# Patient Record
Sex: Female | Born: 1990 | Race: Black or African American | Hispanic: No | Marital: Single | Smoking: Former smoker
Health system: Southern US, Community
[De-identification: ages and names within clinical notes are randomized; demographics above are authoritative.]

## PROBLEM LIST (undated history)

## (undated) DIAGNOSIS — I10 Essential (primary) hypertension: Secondary | ICD-10-CM

## (undated) DIAGNOSIS — F17200 Nicotine dependence, unspecified, uncomplicated: Secondary | ICD-10-CM

## (undated) DIAGNOSIS — J40 Bronchitis, not specified as acute or chronic: Secondary | ICD-10-CM

## (undated) HISTORY — DX: Nicotine dependence, unspecified, uncomplicated: F17.200

## (undated) HISTORY — PX: NO PAST SURGERIES: SHX2092

---

## 2009-07-27 ENCOUNTER — Emergency Department (HOSPITAL_COMMUNITY): Admission: EM | Admit: 2009-07-27 | Discharge: 2009-07-27 | Payer: Self-pay | Admitting: Emergency Medicine

## 2009-12-21 ENCOUNTER — Inpatient Hospital Stay (HOSPITAL_COMMUNITY): Admission: AD | Admit: 2009-12-21 | Discharge: 2009-12-21 | Payer: Self-pay | Admitting: Family Medicine

## 2010-10-10 LAB — URINALYSIS, ROUTINE W REFLEX MICROSCOPIC
Glucose, UA: NEGATIVE mg/dL
Hgb urine dipstick: NEGATIVE
Specific Gravity, Urine: 1.014 (ref 1.005–1.030)
pH: 7 (ref 5.0–8.0)

## 2010-10-10 LAB — GC/CHLAMYDIA PROBE AMP, GENITAL: GC Probe Amp, Genital: NEGATIVE

## 2010-10-10 LAB — WET PREP, GENITAL: Trich, Wet Prep: NONE SEEN

## 2010-10-10 LAB — POCT PREGNANCY, URINE: Preg Test, Ur: NEGATIVE

## 2011-01-13 ENCOUNTER — Ambulatory Visit (INDEPENDENT_AMBULATORY_CARE_PROVIDER_SITE_OTHER): Payer: BC Managed Care – PPO | Admitting: Women's Health

## 2011-01-13 ENCOUNTER — Other Ambulatory Visit: Payer: Self-pay | Admitting: Women's Health

## 2011-01-13 ENCOUNTER — Other Ambulatory Visit (HOSPITAL_COMMUNITY)
Admission: RE | Admit: 2011-01-13 | Discharge: 2011-01-13 | Disposition: A | Discharge status: Home / Self Care | Payer: BC Managed Care – PPO | Source: Ambulatory Visit | Attending: Gynecology | Admitting: Gynecology

## 2011-01-13 DIAGNOSIS — Z124 Encounter for screening for malignant neoplasm of cervix: Secondary | ICD-10-CM | POA: Insufficient documentation

## 2011-01-13 DIAGNOSIS — Z01419 Encounter for gynecological examination (general) (routine) without abnormal findings: Secondary | ICD-10-CM

## 2011-02-11 ENCOUNTER — Emergency Department (HOSPITAL_COMMUNITY)
Admission: EM | Admit: 2011-02-11 | Discharge: 2011-02-12 | Disposition: A | Discharge status: Home / Self Care | Payer: Worker's Compensation | Attending: Emergency Medicine | Admitting: Emergency Medicine

## 2011-02-11 DIAGNOSIS — W260XXA Contact with knife, initial encounter: Secondary | ICD-10-CM | POA: Insufficient documentation

## 2011-02-11 DIAGNOSIS — IMO0002 Reserved for concepts with insufficient information to code with codable children: Secondary | ICD-10-CM | POA: Insufficient documentation

## 2011-02-11 DIAGNOSIS — M79609 Pain in unspecified limb: Secondary | ICD-10-CM | POA: Insufficient documentation

## 2011-02-11 DIAGNOSIS — Z23 Encounter for immunization: Secondary | ICD-10-CM | POA: Insufficient documentation

## 2011-02-11 DIAGNOSIS — Y9289 Other specified places as the place of occurrence of the external cause: Secondary | ICD-10-CM | POA: Insufficient documentation

## 2011-02-11 DIAGNOSIS — J45909 Unspecified asthma, uncomplicated: Secondary | ICD-10-CM | POA: Insufficient documentation

## 2011-02-12 ENCOUNTER — Emergency Department (HOSPITAL_COMMUNITY): Payer: Worker's Compensation

## 2011-03-30 ENCOUNTER — Encounter (HOSPITAL_COMMUNITY): Payer: Self-pay | Admitting: *Deleted

## 2011-03-30 ENCOUNTER — Inpatient Hospital Stay (HOSPITAL_COMMUNITY)
Admission: AD | Admit: 2011-03-30 | Discharge: 2011-03-30 | Disposition: A | Discharge status: Home / Self Care | Payer: BC Managed Care – PPO | Source: Ambulatory Visit | Attending: Family Medicine | Admitting: Family Medicine

## 2011-03-30 DIAGNOSIS — K5900 Constipation, unspecified: Secondary | ICD-10-CM | POA: Insufficient documentation

## 2011-03-30 DIAGNOSIS — J45909 Unspecified asthma, uncomplicated: Secondary | ICD-10-CM | POA: Insufficient documentation

## 2011-03-30 DIAGNOSIS — F17200 Nicotine dependence, unspecified, uncomplicated: Secondary | ICD-10-CM | POA: Insufficient documentation

## 2011-03-30 DIAGNOSIS — R109 Unspecified abdominal pain: Secondary | ICD-10-CM | POA: Insufficient documentation

## 2011-03-30 DIAGNOSIS — R195 Other fecal abnormalities: Secondary | ICD-10-CM | POA: Insufficient documentation

## 2011-03-30 HISTORY — DX: Bronchitis, not specified as acute or chronic: J40

## 2011-03-30 LAB — OCCULT BLOOD X 1 CARD TO LAB, STOOL: Fecal Occult Bld: NEGATIVE

## 2011-03-30 LAB — POCT PREGNANCY, URINE: Preg Test, Ur: NEGATIVE

## 2011-03-30 LAB — CBC
Hemoglobin: 11.7 g/dL — ABNORMAL LOW (ref 12.0–15.0)
Platelets: 284 10*3/uL (ref 150–400)
RBC: 3.65 MIL/uL — ABNORMAL LOW (ref 3.87–5.11)
WBC: 7.7 10*3/uL (ref 4.0–10.5)

## 2011-03-30 NOTE — Progress Notes (Signed)
Black stools x 2wks. Abdominal distention, cramping.

## 2011-03-30 NOTE — Progress Notes (Signed)
Black stools for 2 wks.  Started after stopped using BC powders.

## 2011-03-30 NOTE — Progress Notes (Signed)
Stomach has been 'really big' at times, sometimes has abd pain, sharp pain in lower left, denies pain at this time.  Report freq bowel movements.

## 2011-03-30 NOTE — ED Provider Notes (Signed)
History   Pt presents today c/o black stools. She states she has noticed that her stools have been very dark in color for about 2wks. She also reports mild lower abd cramping. She denies fever, N&V, upper abd pain, vag dc, bleeding, or any other sx at this time. She states her mother was worried that she might be "bleeding inside."  No chief complaint on file.  HPI  OB History    Grav Para Term Preterm Abortions TAB SAB Ect Mult Living   0               Past Medical History  Diagnosis Date  . Smoker   . Asthma   . Constipation   . Bronchitis     Past Surgical History  Procedure Date  . No past surgeries     Family History  Problem Relation Age of Onset  . Hypertension Mother   . Diabetes Paternal Uncle     History  Substance Use Topics  . Smoking status: Current Everyday Smoker -- 0.2 packs/day  . Smokeless tobacco: Never Used  . Alcohol Use: Yes     Very seldom    Allergies: No Known Allergies  No prescriptions prior to admission    Review of Systems  Constitutional: Negative for fever, chills, weight loss and malaise/fatigue.  Cardiovascular: Negative for chest pain.  Gastrointestinal: Positive for abdominal pain. Negative for nausea, vomiting, diarrhea, constipation and blood in stool.       Pt reports very dark colored stools.  Genitourinary: Negative for dysuria, urgency, frequency, hematuria and flank pain.  Neurological: Negative for dizziness and headaches.  Psychiatric/Behavioral: Negative for depression and suicidal ideas.   Physical Exam   Blood pressure 131/84, pulse 90, temperature 98.6 F (37 C), temperature source Oral, resp. rate 20, height 5\' 6"  (1.676 m), weight 210 lb 6.4 oz (95.437 kg).  Physical Exam  Constitutional: She is oriented to person, place, and time. She appears well-developed and well-nourished. No distress.  HENT:  Head: Normocephalic and atraumatic.  Eyes: EOM are normal. Pupils are equal, round, and reactive to light.    GI: Soft. Bowel sounds are normal. She exhibits no distension and no mass. There is no tenderness. There is no rebound and no guarding.  Genitourinary:       Rectum appear NL. No obvious hemorrhoids. Digital rectal exam was performed. No internal hemorrhoids or fissures noted. Small amount of brown stool was collected and a guaiac card was sent to the lab. No obvious blood was noted on exam.  Neurological: She is alert and oriented to person, place, and time.  Skin: Skin is warm and dry. She is not diaphoretic.  Psychiatric: She has a normal mood and affect. Her behavior is normal. Judgment and thought content normal.    MAU Course  Procedures  Results for orders placed during the hospital encounter of 03/30/11 (from the past 24 hour(s))  POCT PREGNANCY, URINE     Status: Normal   Collection Time   03/30/11  4:38 PM      Component Value Range   Preg Test, Ur NEGATIVE    OCCULT BLOOD X 1 CARD TO LAB, STOOL     Status: Normal   Collection Time   03/30/11  5:37 PM      Component Value Range   Fecal Occult Bld NEGATIVE    CBC     Status: Abnormal   Collection Time   03/30/11  5:43 PM      Component  Value Range   WBC 7.7  4.0 - 10.5 (K/uL)   RBC 3.65 (*) 3.87 - 5.11 (MIL/uL)   Hemoglobin 11.7 (*) 12.0 - 15.0 (g/dL)   HCT 16.1  09.6 - 04.5 (%)   MCV 99.5  78.0 - 100.0 (fL)   MCH 32.1  26.0 - 34.0 (pg)   MCHC 32.2  30.0 - 36.0 (g/dL)   RDW 40.9  81.1 - 91.4 (%)   Platelets 284  150 - 400 (K/uL)      Assessment and Plan  Dark stools/abd pain: no acute process noted. Advised pt to f/u with her PCP. Discussed diet, activity, risks, and precautions.  Clinton Gallant. Rice III, DrHSc, MPAS, PA-C  03/30/2011, 5:26 PM   Henrietta Hoover, PA 03/30/11 [phone]

## 2011-04-01 NOTE — ED Provider Notes (Signed)
Chart reviewed and agree with management and plan.  

## 2011-08-31 ENCOUNTER — Encounter (HOSPITAL_COMMUNITY): Payer: Self-pay | Admitting: Emergency Medicine

## 2011-08-31 ENCOUNTER — Emergency Department (HOSPITAL_COMMUNITY)
Admission: EM | Admit: 2011-08-31 | Discharge: 2011-08-31 | Disposition: A | Discharge status: Home / Self Care | Payer: Worker's Compensation | Attending: Emergency Medicine | Admitting: Emergency Medicine

## 2011-08-31 DIAGNOSIS — H5710 Ocular pain, unspecified eye: Secondary | ICD-10-CM | POA: Insufficient documentation

## 2011-08-31 DIAGNOSIS — H2000 Unspecified acute and subacute iridocyclitis: Secondary | ICD-10-CM

## 2011-08-31 MED ORDER — FLUORESCEIN SODIUM 1 MG OP STRP
1.0000 | ORAL_STRIP | Freq: Once | OPHTHALMIC | Status: AC
  Administered 2011-08-31: 1 via OPHTHALMIC
  Filled 2011-08-31: qty 1

## 2011-08-31 MED ORDER — POLYMYXIN B-TRIMETHOPRIM 10000-0.1 UNIT/ML-% OP SOLN
2.0000 [drp] | OPHTHALMIC | Status: DC
  Administered 2011-08-31: 2 [drp] via OPHTHALMIC
  Filled 2011-08-31: qty 10

## 2011-08-31 NOTE — ED Notes (Signed)
Pt had contacts in when doing visual acuity

## 2011-08-31 NOTE — ED Provider Notes (Signed)
Medical screening examination/treatment/procedure(s) were performed by non-physician practitioner and as supervising physician I was immediately available for consultation/collaboration.   Dayton Bailiff, MD 08/31/11 2223

## 2011-08-31 NOTE — ED Provider Notes (Signed)
History     CSN: [id number]  Arrival date & time 08/31/11  2026   First MD Initiated Contact with Patient 08/31/11 2032      No chief complaint on file.   (Consider location/radiation/quality/duration/timing/severity/associated sxs/prior treatment) HPI  20yo female c/o eye pain.  Pt sts she was working last night when her eye was splashed with sanitation water accidentally.  Water splashed to R eye.  She initially did not experience any discomfort.  She was wearing her contact lens.  Pt did not remove her contact lenses.  She slept with it on, and woke up today experiencing blurry vision, burning sensation and redness to her R eye. Pain worsen with bright light. She denies any other injury.  Denies double vision, pressure behind eye, or eye matted shut.  She has been irrigating her eye with water throughout the day.  Past Medical History  Diagnosis Date  . Smoker   . Asthma   . Constipation   . Bronchitis     Past Surgical History  Procedure Date  . No past surgeries     Family History  Problem Relation Age of Onset  . Hypertension Mother   . Diabetes Paternal Uncle     History  Substance Use Topics  . Smoking status: Current Everyday Smoker -- 0.2 packs/day  . Smokeless tobacco: Never Used  . Alcohol Use: Yes     Very seldom    OB History    Grav Para Term Preterm Abortions TAB SAB Ect Mult Living   0               Review of Systems  All other systems reviewed and are negative.    Allergies  Review of patient's allergies indicates no known allergies.  Home Medications  No current outpatient prescriptions on file.  There were no vitals taken for this visit.  Physical Exam  Constitutional: She appears well-developed and well-nourished.  HENT:  Head: Atraumatic.  Eyes: EOM and lids are normal. Pupils are equal, round, and reactive to light. No foreign bodies found. Right eye exhibits no discharge. Left eye exhibits no discharge. Right conjunctiva is  injected. Right conjunctiva has no hemorrhage. Left conjunctiva is not injected. Left conjunctiva has no hemorrhage. No scleral icterus. Right eye exhibits normal extraocular motion and no nystagmus. Left eye exhibits normal extraocular motion and no nystagmus.  Slit lamp exam:      The right eye shows no corneal abrasion, no corneal flare, no corneal ulcer, no foreign body, no hyphema, no fluorescein uptake and no anterior chamber bulge.       The left eye shows no corneal abrasion, no corneal flare, no corneal ulcer, no foreign body, no hyphema, no fluorescein uptake and no anterior chamber bulge.    ED Course  Procedures (including critical care time)  Labs Reviewed - No data to display No results found.   No diagnosis found.    MDM  R Eye contamination by unknown "sanitation water".  Pt has been irrigating her eye throughout the day. She has 20/40 vision to both eyes without contact lenses.  pH is 7 and normal.  No evidence of hyphema, or corneal abrasion.  No ulceration noted.  Pain improves with proparacain.  Will prescribe abx, and f/u instruction.  Pt voice understanding.          Fayrene Helper, PA-C 08/31/11 2141

## 2011-08-31 NOTE — ED Notes (Signed)
Pt states she splashed some sanitizer water in her right eye last night  Pt states she has had pain in her eye since that has got worse throughout the day  Pt states light hurts her eye

## 2012-01-20 ENCOUNTER — Encounter: Payer: Self-pay | Admitting: Family Medicine

## 2012-01-20 ENCOUNTER — Ambulatory Visit (INDEPENDENT_AMBULATORY_CARE_PROVIDER_SITE_OTHER): Payer: BC Managed Care – PPO | Admitting: Family Medicine

## 2012-01-20 VITALS — BP 138/85 | HR 83 | Temp 98.0°F | Resp 16 | Ht 65.5 in | Wt 203.6 lb

## 2012-01-20 DIAGNOSIS — M545 Low back pain, unspecified: Secondary | ICD-10-CM

## 2012-01-20 MED ORDER — CYCLOBENZAPRINE HCL 5 MG PO TABS: 5.0000 mg | ORAL_TABLET | Freq: Every evening | ORAL | Status: AC | PRN

## 2012-01-20 MED ORDER — TRAMADOL HCL 50 MG PO TABS: 50.0000 mg | ORAL_TABLET | Freq: Three times a day (TID) | ORAL | Status: AC | PRN

## 2012-01-20 NOTE — Progress Notes (Signed)
21 yo woman who works at [employer] and [employer].  She was on her feet 16 hours yesterday and developed LBP last night.  No radiation of pain, fever, dysuria or abdominal pain  Patient has h/o scoliosis  Objective: Moderately uncomfortable. Slight scoliosis with convexity to R Tender lumbar paraspinal mm Reflexes: normal SLR:  Negative  Assessment: muscle strain  Plan:   Try yoga Take time off x 2 days  1. Lumbar pain  traMADol (ULTRAM) 50 MG tablet, cyclobenzaprine (FLEXERIL) 5 MG tablet

## 2012-01-20 NOTE — Patient Instructions (Addendum)
Back Pain, Adult Low back pain is very common. About 1 in 5 people have back pain.The cause of low back pain is rarely dangerous. The pain often gets better over time.About half of people with a sudden onset of back pain feel better in just 2 weeks. About 8 in 10 people feel better by 6 weeks.  CAUSES Some common causes of back pain include:  Strain of the muscles or ligaments supporting the spine.   Wear and tear (degeneration) of the spinal discs.   Arthritis.   Direct injury to the back.  DIAGNOSIS Most of the time, the direct cause of low back pain is not known.However, back pain can be treated effectively even when the exact cause of the pain is unknown.Answering your caregiver's questions about your overall health and symptoms is one of the most accurate ways to make sure the cause of your pain is not dangerous. If your caregiver needs more information, he or she may order lab work or imaging tests (X-rays or MRIs).However, even if imaging tests show changes in your back, this usually does not require surgery. HOME CARE INSTRUCTIONS For many people, back pain returns.Since low back pain is rarely dangerous, it is often a condition that people can learn to manageon their own.   Remain active. It is stressful on the back to sit or stand in one place. Do not sit, drive, or stand in one place for more than 30 minutes at a time. Take short walks on level surfaces as soon as pain allows.Try to increase the length of time you walk each day.   Do not stay in bed.Resting more than 1 or 2 days can delay your recovery.   Do not avoid exercise or work.Your body is made to move.It is not dangerous to be active, even though your back may hurt.Your back will likely heal faster if you return to being active before your pain is gone.   Pay attention to your body when you bend and lift. Many people have less discomfortwhen lifting if they bend their knees, keep the load close to their  bodies,and avoid twisting. Often, the most comfortable positions are those that put less stress on your recovering back.   Find a comfortable position to sleep. Use a firm mattress and lie on your side with your knees slightly bent. If you lie on your back, put a pillow under your knees.   Only take over-the-counter or prescription medicines as directed by your caregiver. Over-the-counter medicines to reduce pain and inflammation are often the most helpful.Your caregiver may prescribe muscle relaxant drugs.These medicines help dull your pain so you can more quickly return to your normal activities and healthy exercise.   Put ice on the injured area.   Put ice in a plastic bag.   Place a towel between your skin and the bag.   Leave the ice on for 15 to 20 minutes, 3 to 4 times a day for the first 2 to 3 days. After that, ice and heat may be alternated to reduce pain and spasms.   Ask your caregiver about trying back exercises and gentle massage. This may be of some benefit.   Avoid feeling anxious or stressed.Stress increases muscle tension and can worsen back pain.It is important to recognize when you are anxious or stressed and learn ways to manage it.Exercise is a great option.  SEEK MEDICAL CARE IF:  You have pain that is not relieved with rest or medicine.   You have   pain that does not improve in 1 week.   You have new symptoms.   You are generally not feeling well.  SEEK IMMEDIATE MEDICAL CARE IF:   You have pain that radiates from your back into your legs.   You develop new bowel or bladder control problems.   You have unusual weakness or numbness in your arms or legs.   You develop nausea or vomiting.   You develop abdominal pain.   You feel faint.  Document Released: 07/11/2005 Document Revised: 06/30/2011 Document Reviewed: 11/29/2010 [employer] Patient Information 2012 [employer]. 

## 2012-06-15 ENCOUNTER — Encounter (HOSPITAL_COMMUNITY): Payer: Self-pay | Admitting: Emergency Medicine

## 2012-06-15 ENCOUNTER — Emergency Department (HOSPITAL_COMMUNITY)
Admission: EM | Admit: 2012-06-15 | Discharge: 2012-06-15 | Disposition: A | Discharge status: Home / Self Care | Payer: BC Managed Care – PPO | Attending: Emergency Medicine | Admitting: Emergency Medicine

## 2012-06-15 ENCOUNTER — Emergency Department (HOSPITAL_COMMUNITY): Payer: BC Managed Care – PPO

## 2012-06-15 DIAGNOSIS — J45909 Unspecified asthma, uncomplicated: Secondary | ICD-10-CM | POA: Insufficient documentation

## 2012-06-15 DIAGNOSIS — Y929 Unspecified place or not applicable: Secondary | ICD-10-CM | POA: Insufficient documentation

## 2012-06-15 DIAGNOSIS — Y939 Activity, unspecified: Secondary | ICD-10-CM | POA: Insufficient documentation

## 2012-06-15 DIAGNOSIS — F17200 Nicotine dependence, unspecified, uncomplicated: Secondary | ICD-10-CM | POA: Insufficient documentation

## 2012-06-15 DIAGNOSIS — W108XXA Fall (on) (from) other stairs and steps, initial encounter: Secondary | ICD-10-CM | POA: Insufficient documentation

## 2012-06-15 DIAGNOSIS — Z8719 Personal history of other diseases of the digestive system: Secondary | ICD-10-CM | POA: Insufficient documentation

## 2012-06-15 DIAGNOSIS — S93409A Sprain of unspecified ligament of unspecified ankle, initial encounter: Secondary | ICD-10-CM

## 2012-06-15 MED ORDER — OXYCODONE-ACETAMINOPHEN 5-325 MG PO TABS
1.0000 | ORAL_TABLET | Freq: Once | ORAL | Status: AC
  Administered 2012-06-15: 1 via ORAL
  Filled 2012-06-15: qty 1

## 2012-06-15 NOTE — ED Notes (Signed)
Pt alert, arrives from work, c/o right ankle pain, onset was yesterday, pt stats slip fall injury walking down stairs, pt ambulates to triage, resp even unlabored, skin pwd

## 2012-06-15 NOTE — ED Provider Notes (Signed)
History     CSN: [id number]  Arrival date & time 06/15/12  0702   First MD Initiated Contact with Patient 06/15/12 [phone]      Chief Complaint  Patient presents with  . Ankle Pain    (Consider location/radiation/quality/duration/timing/severity/associated sxs/prior treatment) HPI Pt reports she tripped and fell down some stairs last night, twisting her R ankle. Complaining of moderate aching pain to R ankle, primarily the medial side, worse with walking but able to bear weight and was at work prior to arrival.   Past Medical History  Diagnosis Date  . Smoker   . Asthma   . Constipation   . Bronchitis     Past Surgical History  Procedure Date  . No past surgeries     Family History  Problem Relation Age of Onset  . Hypertension Mother   . Diabetes Paternal Uncle     History  Substance Use Topics  . Smoking status: Current Some Day Smoker -- 0.2 packs/day  . Smokeless tobacco: Never Used  . Alcohol Use: Yes     Comment: Very seldom    OB History    Grav Para Term Preterm Abortions TAB SAB Ect Mult Living   0               Review of Systems All other systems reviewed and are negative except as noted in HPI.   Allergies  Review of patient's allergies indicates no known allergies.  Home Medications   Current Outpatient Rx  Name  Route  Sig  Dispense  Refill  . FOLIC ACID 400 MCG PO TABS   Oral   Take 400 mcg by mouth daily.         . IBUPROFEN 200 MG PO TABS   Oral   Take 200 mg by mouth every 6 (six) hours as needed.           BP 109/70  Pulse 80  Temp 98 F (36.7 C) (Oral)  Resp 16  SpO2 99%  LMP 06/01/2012  Physical Exam  Constitutional: She is oriented to person, place, and time. She appears well-developed and well-nourished.  HENT:  Head: Normocephalic and atraumatic.  Neck: Neck supple.  Pulmonary/Chest: Effort normal.  Musculoskeletal: She exhibits tenderness (tender over both malleoli, no 5th metatarsal tenderness,  neurovascularly intact). She exhibits no edema.  Neurological: She is alert and oriented to person, place, and time. No cranial nerve deficit.  Psychiatric: She has a normal mood and affect. Her behavior is normal.    ED Course  Procedures (including critical care time)  Labs Reviewed - No data to display Dg Ankle Complete Right  06/15/2012  *RADIOLOGY REPORT*  Clinical Data: Pt fell down steps and twisted rt ankle, pain,sts lateral rt ankle  RIGHT ANKLE - COMPLETE 3+ VIEW  Comparison: None.  Findings: Well corticated secondary ossification center noted below the medial malleolus.  Mild soft tissue swelling overlies the lateral malleolus without underlying fracture.  There is anterior soft tissue swelling as well.  Plafond and talar dome intact. Fifth metatarsal base intact.  Os trigonum noted.  Subtalar articulations unremarkable.  IMPRESSION:  1.  Soft tissue swelling anteriorly and laterally, without underlying fracture observed.   Original Report Authenticated By: Gaylyn Rong, M.D.      No diagnosis found.    MDM  Xray neg. Pt given percocet by nurse under protocol. Pt drove here. She is looking for a ride. ASO, crutches.         Leonette Most  Raiford Simmonds, MD 06/15/12 [phone]

## 2013-07-20 ENCOUNTER — Emergency Department (HOSPITAL_COMMUNITY)
Admission: EM | Admit: 2013-07-20 | Discharge: 2013-07-20 | Disposition: A | Discharge status: Home / Self Care | Payer: BC Managed Care – PPO | Source: Home / Self Care | Attending: Family Medicine | Admitting: Family Medicine

## 2013-07-20 ENCOUNTER — Encounter (HOSPITAL_COMMUNITY): Payer: Self-pay | Admitting: Emergency Medicine

## 2013-07-20 DIAGNOSIS — M778 Other enthesopathies, not elsewhere classified: Secondary | ICD-10-CM

## 2013-07-20 DIAGNOSIS — M7011 Bursitis, right hand: Secondary | ICD-10-CM

## 2013-07-20 MED ORDER — METHYLPREDNISOLONE 4 MG PO KIT: PACK | ORAL | Status: DC

## 2013-07-20 MED ORDER — NAPROXEN 375 MG PO TABS: 375.0000 mg | ORAL_TABLET | Freq: Two times a day (BID) | ORAL | Status: DC

## 2013-07-20 NOTE — ED Provider Notes (Signed)
Medical screening examination/treatment/procedure(s) were performed by resident physician or non-physician practitioner and as supervising physician I was immediately available for consultation/collaboration.   Thor Nannini DOUGLAS MD.   Merry Pond D Sheralyn Pinegar, MD 07/20/13 1509 

## 2013-07-20 NOTE — ED Notes (Signed)
Pt c/o right wrist pain onset 3 months w/some swelling... 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 She is alert w/no signs of acute distress.

## 2013-07-20 NOTE — ED Provider Notes (Signed)
CSN: [id number]     Arrival date & time 07/20/13  9563 History   First MD Initiated Contact with Patient 07/20/13 1123     Chief Complaint  Patient presents with  . Wrist Pain   (Consider location/radiation/quality/duration/timing/severity/associated sxs/prior Treatment) HPI Comments: Patient with history of recurrent right wrist pain on dominant side. No report of recent or remote injury. 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 No history of gout.   Patient is a 22 y.o. female presenting with wrist pain. The history is provided by the patient.  Wrist Pain This is a recurrent problem. The current episode started more than 2 days ago. She has tried nothing for the symptoms.    Past Medical History  Diagnosis Date  . Smoker   . Asthma   . Constipation   . Bronchitis    Past Surgical History  Procedure Laterality Date  . No past surgeries     Family History  Problem Relation Age of Onset  . Hypertension Mother   . Diabetes Paternal Uncle    History  Substance Use Topics  . Smoking status: Current Some Day Smoker -- 0.25 packs/day  . Smokeless tobacco: Never Used  . Alcohol Use: Yes     Comment: Very seldom   OB History   Grav Para Term Preterm Abortions TAB SAB Ect Mult Living   0              Review of Systems  All other systems reviewed and are negative.    Allergies  Review of patient's allergies indicates no known allergies.  Home Medications   Current Outpatient Rx  Name  Route  Sig  Dispense  Refill  . ibuprofen (ADVIL,MOTRIN) 200 MG tablet   Oral   Take 400 mg by mouth every 6 (six) hours as needed. Pain          BP 127/83  Pulse 75  Temp(Src) 98 F (36.7 C) (Oral)  Resp 18  SpO2 100%  LMP 06/04/2013 Physical Exam  Nursing note and vitals reviewed. Constitutional: She is oriented to person, place, and time. She appears well-developed and well-nourished. No distress.  Eyes: Conjunctivae are normal.   Cardiovascular: Normal rate.   Pulmonary/Chest: Effort normal.  Musculoskeletal: She exhibits tenderness. She exhibits no edema.       Right wrist: She exhibits tenderness. She exhibits normal range of motion, no bony tenderness, no swelling, no effusion, no crepitus, no deformity and no laceration.  Right hand CSM intact. Median, ulnar and radial nerve function intact. +moderate pain at entire right wrist with ROM.   Neurological: She is alert and oriented to person, place, and time.  Skin: Skin is warm and dry. No rash noted. No erythema. No pallor.  Psychiatric: She has a normal mood and affect. Her behavior is normal.    ED Course  Procedures (including critical care time) Labs Review Labs Reviewed - No data to display Imaging Review No results found.  EKG Interpretation    Date/Time:    Ventricular Rate:    PR Interval:    QRS Duration:   QT Interval:    QTC Calculation:   R Axis:     Text Interpretation:              MDM  Patient with history of recurrent right wrist pain on dominant side. No report of recent or remote injury. 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 Will treat for wrist bursitis with splint, RICE therapy, short course of oral steroid with transition to daily naprosyn once steroid complete and refer to hand ortho for follow up if symptoms do not improve.     Jess Barters [place], [place] 07/20/13 [phone]

## 2014-03-18 ENCOUNTER — Encounter (HOSPITAL_COMMUNITY): Payer: Self-pay | Admitting: Emergency Medicine

## 2014-03-18 ENCOUNTER — Emergency Department (HOSPITAL_COMMUNITY)
Admission: EM | Admit: 2014-03-18 | Discharge: 2014-03-18 | Disposition: A | Discharge status: Home / Self Care | Payer: BC Managed Care – PPO | Attending: Family Medicine | Admitting: Family Medicine

## 2014-03-18 DIAGNOSIS — Z8719 Personal history of other diseases of the digestive system: Secondary | ICD-10-CM | POA: Insufficient documentation

## 2014-03-18 DIAGNOSIS — J45909 Unspecified asthma, uncomplicated: Secondary | ICD-10-CM | POA: Diagnosis not present

## 2014-03-18 DIAGNOSIS — Y9389 Activity, other specified: Secondary | ICD-10-CM | POA: Diagnosis not present

## 2014-03-18 DIAGNOSIS — Z23 Encounter for immunization: Secondary | ICD-10-CM | POA: Diagnosis not present

## 2014-03-18 DIAGNOSIS — S61409A Unspecified open wound of unspecified hand, initial encounter: Secondary | ICD-10-CM | POA: Diagnosis present

## 2014-03-18 DIAGNOSIS — F17200 Nicotine dependence, unspecified, uncomplicated: Secondary | ICD-10-CM | POA: Diagnosis not present

## 2014-03-18 DIAGNOSIS — IMO0002 Reserved for concepts with insufficient information to code with codable children: Secondary | ICD-10-CM | POA: Insufficient documentation

## 2014-03-18 DIAGNOSIS — Y9289 Other specified places as the place of occurrence of the external cause: Secondary | ICD-10-CM | POA: Insufficient documentation

## 2014-03-18 DIAGNOSIS — Z791 Long term (current) use of non-steroidal anti-inflammatories (NSAID): Secondary | ICD-10-CM | POA: Insufficient documentation

## 2014-03-18 DIAGNOSIS — S60511A Abrasion of right hand, initial encounter: Secondary | ICD-10-CM

## 2014-03-18 MED ORDER — TETANUS-DIPHTH-ACELL PERTUSSIS 5-2.5-18.5 LF-MCG/0.5 IM SUSP
0.5000 mL | Freq: Once | INTRAMUSCULAR | Status: AC
  Administered 2014-03-18: 0.5 mL via INTRAMUSCULAR
  Filled 2014-03-18: qty 0.5

## 2014-03-18 NOTE — ED Notes (Signed)
Pt reports punching a mirror today, has small lacerations and abrasion to right hand, bleeding controlled and bandage applied at triage. Unknown tetanus.

## 2014-03-18 NOTE — Discharge Instructions (Signed)
Wash hand regularly and use neosporin ointment as needed.

## 2014-03-18 NOTE — ED Provider Notes (Signed)
CSN: [id number]     Arrival date & time 03/18/14  1120 History   First MD Initiated Contact with Patient 03/18/14 1245     Chief Complaint  Patient presents with  . Laceration     (Consider location/radiation/quality/duration/timing/severity/associated sxs/prior Treatment) Patient is a 23 y.o. female presenting with skin laceration. The history is provided by the patient.  Laceration Location:  Hand Hand laceration location:  Dorsum of R hand Depth:  Cutaneous Quality: avulsion   Bleeding: controlled   Injury mechanism: punched a mirror this am from  Pain details:    Quality:  Sharp   Severity:  Mild   Progression:  Unchanged Foreign body present:  No foreign bodies Tetanus status:  Out of date   Past Medical History  Diagnosis Date  . Smoker   . Asthma   . Constipation   . Bronchitis    Past Surgical History  Procedure Laterality Date  . No past surgeries     Family History  Problem Relation Age of Onset  . Hypertension Mother   . Diabetes Paternal Uncle    History  Substance Use Topics  . Smoking status: Current Some Day Smoker -- 0.25 packs/day  . Smokeless tobacco: Never Used  . Alcohol Use: Yes     Comment: Very seldom   OB History   Grav Para Term Preterm Abortions TAB SAB Ect Mult Living   0              Review of Systems  Constitutional: Negative.   Skin: Positive for wound.      Allergies  Review of patient's allergies indicates no known allergies.  Home Medications   Prior to Admission medications   Medication Sig Start Date End Date Taking? Authorizing Provider  ibuprofen (ADVIL,MOTRIN) 200 MG tablet Take 400 mg by mouth every 6 (six) hours as needed. Pain    Historical Provider, MD  methylPREDNISolone (MEDROL DOSEPAK) 4 MG tablet Take as directed 07/20/13   Ria Clock, PA  naproxen (NAPROSYN) 375 MG tablet Take 1 tablet (375 mg total) by mouth 2 (two) times daily. Begin when finished with medrol dose pack 07/20/13    Mathis Fare Presson, PA   BP 144/113  Pulse 97  Temp(Src) 98.3 F (36.8 C) (Oral)  Resp 18  Ht  (1.676 m)  Wt 195 lb (88.451 kg)  BMI 31.49 kg/m2  SpO2 97%  LMP 03/04/2014 Physical Exam  Nursing note and vitals reviewed. Constitutional: She is oriented to person, place, and time. She appears well-developed and well-nourished.  Musculoskeletal: She exhibits tenderness.  Multiple superficial skin abrasions to dorsum of right hand.,no fb, nvt intact.  Neurological: She is alert and oriented to person, place, and time.  Skin: Skin is warm and dry.    ED Course  Procedures (including critical care time) Labs Review Labs Reviewed - No data to display  Imaging Review No results found.   EKG Interpretation None      MDM  Wound care given. Final diagnoses:  Hand abrasion, right, initial encounter       Linna Hoff, MD 03/18/14 1301

## 2014-03-18 NOTE — ED Notes (Signed)
Placed bacitracin and 2x2 gauze on index,middle and ring finger; wrapped in coban 2 in

## 2015-09-06 ENCOUNTER — Emergency Department (HOSPITAL_COMMUNITY)
Admission: EM | Admit: 2015-09-06 | Discharge: 2015-09-07 | Disposition: A | Discharge status: Home / Self Care | Payer: BC Managed Care – PPO | Attending: Emergency Medicine | Admitting: Emergency Medicine

## 2015-09-06 ENCOUNTER — Encounter (HOSPITAL_COMMUNITY): Payer: Self-pay | Admitting: *Deleted

## 2015-09-06 ENCOUNTER — Emergency Department (HOSPITAL_COMMUNITY): Payer: BC Managed Care – PPO

## 2015-09-06 DIAGNOSIS — J45909 Unspecified asthma, uncomplicated: Secondary | ICD-10-CM | POA: Insufficient documentation

## 2015-09-06 DIAGNOSIS — Y9367 Activity, basketball: Secondary | ICD-10-CM | POA: Insufficient documentation

## 2015-09-06 DIAGNOSIS — Y998 Other external cause status: Secondary | ICD-10-CM | POA: Insufficient documentation

## 2015-09-06 DIAGNOSIS — W1839XA Other fall on same level, initial encounter: Secondary | ICD-10-CM | POA: Insufficient documentation

## 2015-09-06 DIAGNOSIS — Y9289 Other specified places as the place of occurrence of the external cause: Secondary | ICD-10-CM | POA: Insufficient documentation

## 2015-09-06 DIAGNOSIS — S99912A Unspecified injury of left ankle, initial encounter: Secondary | ICD-10-CM | POA: Insufficient documentation

## 2015-09-06 DIAGNOSIS — Z8719 Personal history of other diseases of the digestive system: Secondary | ICD-10-CM | POA: Insufficient documentation

## 2015-09-06 DIAGNOSIS — F17200 Nicotine dependence, unspecified, uncomplicated: Secondary | ICD-10-CM | POA: Insufficient documentation

## 2015-09-06 MED ORDER — OXYCODONE-ACETAMINOPHEN 5-325 MG PO TABS
1.0000 | ORAL_TABLET | Freq: Once | ORAL | Status: AC
  Administered 2015-09-06: 1 via ORAL
  Filled 2015-09-06: qty 1

## 2015-09-06 NOTE — ED Notes (Signed)
Pt states that she was playing basketball around 4pm and landed on left ankle the "wrong way"; pt c/o pain and swelling to left ankle; + pulse; + sensation

## 2015-09-07 MED ORDER — IBUPROFEN 600 MG PO TABS: 600.0000 mg | ORAL_TABLET | Freq: Four times a day (QID) | ORAL | Status: DC | PRN

## 2015-09-07 MED ORDER — HYDROCODONE-ACETAMINOPHEN 5-325 MG PO TABS: 1.0000 | ORAL_TABLET | ORAL | Status: DC | PRN

## 2015-09-07 MED ORDER — KETOROLAC TROMETHAMINE 60 MG/2ML IM SOLN
60.0000 mg | Freq: Once | INTRAMUSCULAR | Status: AC
  Administered 2015-09-07: 60 mg via INTRAMUSCULAR
  Filled 2015-09-07: qty 2

## 2015-09-07 NOTE — Discharge Instructions (Signed)
Ankle Sprain  An ankle sprain is an injury to the strong, fibrous tissues (ligaments) that hold the bones of your ankle joint together.   CAUSES  An ankle sprain is usually caused by a fall or by twisting your ankle. Ankle sprains most commonly occur when you step on the outer edge of your foot, and your ankle turns inward. People who participate in sports are more prone to these types of injuries.   SYMPTOMS    Pain in your ankle. The pain may be present at rest or only when you are trying to stand or walk.   Swelling.   Bruising. Bruising may develop immediately or within 1 to 2 days after your injury.   Difficulty standing or walking, particularly when turning corners or changing directions.  DIAGNOSIS   Your caregiver will ask you details about your injury and perform a physical exam of your ankle to determine if you have an ankle sprain. During the physical exam, your caregiver will press on and apply pressure to specific areas of your foot and ankle. Your caregiver will try to move your ankle in certain ways. An X-ray exam may be done to be sure a bone was not broken or a ligament did not separate from one of the bones in your ankle (avulsion fracture).   TREATMENT   Certain types of braces can help stabilize your ankle. Your caregiver can make a recommendation for this. Your caregiver may recommend the use of medicine for pain. If your sprain is severe, your caregiver may refer you to a surgeon who helps to restore function to parts of your skeletal system (orthopedist) or a physical therapist.  HOME CARE INSTRUCTIONS    Apply ice to your injury for 1-2 days or as directed by your caregiver. Applying ice helps to reduce inflammation and pain.    Put ice in a plastic bag.    Place a towel between your skin and the bag.    Leave the ice on for 15-20 minutes at a time, every 2 hours while you are awake.   Only take over-the-counter or prescription medicines for pain, discomfort, or fever as directed by  your caregiver.   Elevate your injured ankle above the level of your heart as much as possible for 2-3 days.   If your caregiver recommends crutches, use them as instructed. Gradually put weight on the affected ankle. Continue to use crutches or a cane until you can walk without feeling pain in your ankle.   If you have a plaster splint, wear the splint as directed by your caregiver. Do not rest it on anything harder than a pillow for the first 24 hours. Do not put weight on it. Do not get it wet. You may take it off to take a shower or bath.   You may have been given an elastic bandage to wear around your ankle to provide support. If the elastic bandage is too tight (you have numbness or tingling in your foot or your foot becomes cold and blue), adjust the bandage to make it comfortable.   If you have an air splint, you may blow more air into it or let air out to make it more comfortable. You may take your splint off at night and before taking a shower or bath. Wiggle your toes in the splint several times per day to decrease swelling.  SEEK MEDICAL CARE IF:    You have rapidly increasing bruising or swelling.   Your toes feel   extremely cold or you lose feeling in your foot.   Your pain is not relieved with medicine.  SEEK IMMEDIATE MEDICAL CARE IF:   Your toes are numb or blue.   You have severe pain that is increasing.  MAKE SURE YOU:    Understand these instructions.   Will watch your condition.   Will get help right away if you are not doing well or get worse.     This information is not intended to replace advice given to you by your health care provider. Make sure you discuss any questions you have with your health care provider.     Document Released: 07/11/2005 Document Revised: 08/01/2014 Document Reviewed: 07/23/2011  Elsevier Interactive Patient Education 2016 [employer] Inc[employer]

## 2015-09-07 NOTE — ED Provider Notes (Signed)
CSN: [id number]     Arrival date & time 09/06/15  2107 History   First MD Initiated Contact with Patient 09/07/15 0001     Chief Complaint  Patient presents with  . Ankle Injury     (Consider location/radiation/quality/duration/timing/severity/associated sxs/prior Treatment) HPI   Patient to the ER with complaints of ankle injury to the left ankle on 09/06/2015 around 3 pm. She reports playing basketball and her ankle going one way while her body went another direction adn then she fell on it. She did not hit her head, injure her neck, or have loc. Denies any other injury. Her pain is 10/10 and she is crying. Denies numbness or tingling. Denies weakness. Denies back injury.  Past Medical History  Diagnosis Date  . Smoker   . Asthma   . Constipation   . Bronchitis    Past Surgical History  Procedure Laterality Date  . No past surgeries     Family History  Problem Relation Age of Onset  . Hypertension Mother   . Diabetes Paternal Uncle    Social History  Substance Use Topics  . Smoking status: Current Some Day Smoker -- 0.25 packs/day  . Smokeless tobacco: Never Used  . Alcohol Use: Yes     Comment: socially   OB History    Gravida Para Term Preterm AB TAB SAB Ectopic Multiple Living   0              Review of Systems  Review of Systems All other systems negative except as documented in the HPI. All pertinent positives and negatives as reviewed in the HPI.   Allergies  Review of patient's allergies indicates no known allergies.  Home Medications   Prior to Admission medications   Medication Sig Start Date End Date Taking? Authorizing Provider  HYDROcodone-acetaminophen (NORCO/VICODIN) 5-325 MG tablet Take 1-2 tablets by mouth every 4 (four) hours as needed. 09/07/15   Remington Skalsky Neva Seat, PA-C  ibuprofen (ADVIL,MOTRIN) 200 MG tablet Take 400 mg by mouth every 6 (six) hours as needed. Pain    Historical Provider, MD  ibuprofen (ADVIL,MOTRIN) 600 MG tablet Take 1 tablet  (600 mg total) by mouth every 6 (six) hours as needed. 09/07/15   Marlon Pel, PA-C   LMP 09/01/2015 Physical Exam  Constitutional: She appears well-developed and well-nourished. No distress.  HENT:  Head: Normocephalic and atraumatic.  Eyes: Pupils are equal, round, and reactive to light.  Neck: Normal range of motion. Neck supple.  Cardiovascular: Normal rate and regular rhythm.   Pulmonary/Chest: Effort normal.  Abdominal: Soft.  Musculoskeletal:       Left ankle: Achilles tendon normal.  Patient has severe pain to the left ankle. She has decreased range of motion, due to pain she will not allow me to evaluate her ligaments. She has swelling to lateral malleolus without ecchymosis, deformity, laceration or abnormal pulse.  Neurological: She is alert.  Skin: Skin is warm and dry.  Nursing note and vitals reviewed.   ED Course  Procedures (including critical care time) Labs Review Labs Reviewed - No data to display  Imaging Review Dg Ankle Complete Left  09/06/2015  CLINICAL DATA:  25 year old female with ankle injury. EXAM: LEFT ANKLE COMPLETE - 3+ VIEW COMPARISON:  None. FINDINGS: There is no acute fracture or dislocation. The ankle mortise is intact. A well corticated small bony fragment noted adjacent to the tip of the medial malleolus which may represent a small ossicle or related to prior fracture. 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 There is mild soft tissue swelling over the lateral malleolus. No significant joint effusion. IMPRESSION: No acute fracture or dislocation. Benign-appearing focal cortical sclerotic lesion in the distal tibia. Electronically Signed   By: Elgie Collard M.D.   On: 09/06/2015 23:28   I have personally reviewed and evaluated these images and lab results as part of my medical decision-making.   EKG Interpretation None       MDM   Final diagnoses:  Ankle injury, left, initial encounter    Martika's r xray  Of the left ankle is negative but I have concern for ligament injury or occult fracture based on exam. She is crying, pain medication does not help, has severe pain if I touch it and refuses to range it. Will place her on crutches, short leg splint and refer to Ortho. Discussed return precautions.  Medications  ketorolac (TORADOL) injection 60 mg (not administered)  oxyCODONE-acetaminophen (PERCOCET/ROXICET) 5-325 MG per tablet 1 tablet (1 tablet Oral Given 09/06/15 2211)        Marlon Pel, PA-C 09/07/15 0031  Devoria Albe, MD 09/07/15 4098

## 2015-09-07 NOTE — Progress Notes (Signed)
Orthopedic Tech Progress Note Patient Details:  Amber Wells 1991-03-09 [id number]  Ortho Devices Type of Ortho Device: Post (short leg) splint, Stirrup splint Ortho Device/Splint Location: lle Ortho Device/Splint Interventions: Ordered, Application   Trinna Post 09/07/2015, 1:19 AM

## 2015-09-07 NOTE — ED Notes (Signed)
Ortho tech at bedside 

## 2015-09-10 ENCOUNTER — Other Ambulatory Visit: Payer: Self-pay | Admitting: Surgical

## 2016-01-26 ENCOUNTER — Emergency Department (HOSPITAL_COMMUNITY): Payer: No Typology Code available for payment source

## 2016-01-26 ENCOUNTER — Emergency Department (HOSPITAL_COMMUNITY)
Admission: EM | Admit: 2016-01-26 | Discharge: 2016-01-26 | Disposition: A | Discharge status: Home / Self Care | Payer: No Typology Code available for payment source | Attending: Emergency Medicine | Admitting: Emergency Medicine

## 2016-01-26 ENCOUNTER — Encounter (HOSPITAL_COMMUNITY): Payer: Self-pay | Admitting: Emergency Medicine

## 2016-01-26 DIAGNOSIS — Y92410 Unspecified street and highway as the place of occurrence of the external cause: Secondary | ICD-10-CM | POA: Diagnosis not present

## 2016-01-26 DIAGNOSIS — M25511 Pain in right shoulder: Secondary | ICD-10-CM

## 2016-01-26 DIAGNOSIS — R0789 Other chest pain: Secondary | ICD-10-CM

## 2016-01-26 DIAGNOSIS — S40211A Abrasion of right shoulder, initial encounter: Secondary | ICD-10-CM | POA: Diagnosis not present

## 2016-01-26 DIAGNOSIS — F17200 Nicotine dependence, unspecified, uncomplicated: Secondary | ICD-10-CM | POA: Insufficient documentation

## 2016-01-26 DIAGNOSIS — Y999 Unspecified external cause status: Secondary | ICD-10-CM | POA: Diagnosis not present

## 2016-01-26 DIAGNOSIS — J45909 Unspecified asthma, uncomplicated: Secondary | ICD-10-CM | POA: Insufficient documentation

## 2016-01-26 DIAGNOSIS — S30811A Abrasion of abdominal wall, initial encounter: Secondary | ICD-10-CM | POA: Diagnosis not present

## 2016-01-26 DIAGNOSIS — S3991XA Unspecified injury of abdomen, initial encounter: Secondary | ICD-10-CM | POA: Diagnosis present

## 2016-01-26 DIAGNOSIS — R1084 Generalized abdominal pain: Secondary | ICD-10-CM

## 2016-01-26 DIAGNOSIS — Y939 Activity, unspecified: Secondary | ICD-10-CM | POA: Diagnosis not present

## 2016-01-26 LAB — I-STAT BETA HCG BLOOD, ED (MC, WL, AP ONLY): I-stat hCG, quantitative: <5 m[IU]/mL (ref <5)

## 2016-01-26 LAB — COMPREHENSIVE METABOLIC PANEL
ALBUMIN: 3.7 g/dL (ref 3.5–5.0)
ALT: 18 U/L (ref 14–54)
AST: 20 U/L (ref 15–41)
Alkaline Phosphatase: 37 U/L — ABNORMAL LOW (ref 38–126)
Anion gap: 6 (ref 5–15)
BILIRUBIN TOTAL: 0.5 mg/dL (ref 0.3–1.2)
BUN: 10 mg/dL (ref 6–20)
CALCIUM: 9.3 mg/dL (ref 8.9–10.3)
CO2: 24 mmol/L (ref 22–32)
CREATININE: 0.93 mg/dL (ref 0.44–1.00)
Chloride: 108 mmol/L (ref 101–111)
GFR calc non Af Amer: >60 mL/min (ref >60)
GLUCOSE: 106 mg/dL — AB (ref 65–99)
Potassium: 3.7 mmol/L (ref 3.5–5.1)
SODIUM: 138 mmol/L (ref 135–145)
Total Protein: 6.6 g/dL (ref 6.5–8.1)

## 2016-01-26 LAB — CBC WITH DIFFERENTIAL/PLATELET
BASOS PCT: 0 %
Basophils Absolute: 0 10*3/uL (ref 0.0–0.1)
EOS ABS: 0.2 10*3/uL (ref 0.0–0.7)
Eosinophils Relative: 2 %
HCT: 34.5 % — ABNORMAL LOW (ref 36.0–46.0)
Hemoglobin: 11.1 g/dL — ABNORMAL LOW (ref 12.0–15.0)
LYMPHS ABS: 2.5 10*3/uL (ref 0.7–4.0)
Lymphocytes Relative: 21 %
MCH: 31.4 pg (ref 26.0–34.0)
MCHC: 32.2 g/dL (ref 30.0–36.0)
MCV: 97.7 fL (ref 78.0–100.0)
MONO ABS: 0.7 10*3/uL (ref 0.1–1.0)
MONOS PCT: 6 %
Neutro Abs: 8.2 10*3/uL — ABNORMAL HIGH (ref 1.7–7.7)
Neutrophils Relative %: 71 %
Platelets: 258 10*3/uL (ref 150–400)
RBC: 3.53 MIL/uL — ABNORMAL LOW (ref 3.87–5.11)
RDW: 11.8 % (ref 11.5–15.5)
WBC: 11.5 10*3/uL — ABNORMAL HIGH (ref 4.0–10.5)

## 2016-01-26 LAB — I-STAT TROPONIN, ED: Troponin i, poc: 0 ng/mL (ref 0.00–0.08)

## 2016-01-26 MED ORDER — TETANUS-DIPHTH-ACELL PERTUSSIS 5-2.5-18.5 LF-MCG/0.5 IM SUSP
0.5000 mL | Freq: Once | INTRAMUSCULAR | Status: AC
  Administered 2016-01-26: 0.5 mL via INTRAMUSCULAR
  Filled 2016-01-26: qty 0.5

## 2016-01-26 MED ORDER — CYCLOBENZAPRINE HCL 10 MG PO TABS: 10.0000 mg | ORAL_TABLET | Freq: Three times a day (TID) | ORAL | Status: DC | PRN

## 2016-01-26 MED ORDER — MORPHINE SULFATE (PF) 4 MG/ML IV SOLN
4.0000 mg | Freq: Once | INTRAVENOUS | Status: AC
  Administered 2016-01-26: 4 mg via INTRAVENOUS
  Filled 2016-01-26: qty 1

## 2016-01-26 MED ORDER — HYDROCODONE-ACETAMINOPHEN 5-325 MG PO TABS: 1.0000 | ORAL_TABLET | Freq: Four times a day (QID) | ORAL | Status: DC | PRN

## 2016-01-26 MED ORDER — NAPROXEN 500 MG PO TABS: 500.0000 mg | ORAL_TABLET | Freq: Two times a day (BID) | ORAL | Status: DC | PRN

## 2016-01-26 MED ORDER — IOPAMIDOL (ISOVUE-300) INJECTION 61%
INTRAVENOUS | Status: AC
Start: 2016-01-26 — End: 2016-01-26
  Administered 2016-01-26: 100 mL
  Filled 2016-01-26: qty 100
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
## 2016-09-03 ENCOUNTER — Ambulatory Visit (HOSPITAL_COMMUNITY)
Admission: EM | Admit: 2016-09-03 | Discharge: 2016-09-03 | Disposition: A | Discharge status: Home / Self Care | Payer: PRIVATE HEALTH INSURANCE | Attending: Internal Medicine | Admitting: Internal Medicine

## 2016-09-03 ENCOUNTER — Encounter (HOSPITAL_COMMUNITY): Payer: Self-pay | Admitting: *Deleted

## 2016-09-03 DIAGNOSIS — R69 Illness, unspecified: Secondary | ICD-10-CM

## 2016-09-03 DIAGNOSIS — R030 Elevated blood-pressure reading, without diagnosis of hypertension: Secondary | ICD-10-CM | POA: Diagnosis not present

## 2016-09-03 DIAGNOSIS — J111 Influenza due to unidentified influenza virus with other respiratory manifestations: Secondary | ICD-10-CM

## 2016-09-03 MED ORDER — IBUPROFEN 600 MG PO TABS: 600.0000 mg | ORAL_TABLET | Freq: Four times a day (QID) | ORAL | 0 refills | Status: DC | PRN

## 2016-09-03 MED ORDER — OSELTAMIVIR PHOSPHATE 75 MG PO CAPS: 75.0000 mg | ORAL_CAPSULE | Freq: Two times a day (BID) | ORAL | 0 refills | Status: DC

## 2016-09-03 NOTE — ED Provider Notes (Signed)
CSN: [id number]     Arrival date & time 09/03/16  1900 History   First MD Initiated Contact with Patient 09/03/16 2113     Chief Complaint  Patient presents with  . Cough   (Consider location/radiation/quality/duration/timing/severity/associated sxs/prior Treatment) 26 year old presents with body aches cough and headache. The timeline is somewhat confusing in that she has had a little headache and cough for about a week however she developed malaise, fatigue and severe body aches this morning. She is uncertain if she has had fever but she has had cold chills. Denies earaches.      Past Medical History:  Diagnosis Date  . Asthma   . Bronchitis   . Constipation   . Smoker    Past Surgical History:  Procedure Laterality Date  . NO PAST SURGERIES     Family History  Problem Relation Age of Onset  . Hypertension Mother   . Diabetes Paternal Uncle    Social History  Substance Use Topics  . Smoking status: Current Some Day Smoker    Packs/day: 0.25  . Smokeless tobacco: Never Used  . Alcohol use Yes     Comment: socially   OB History    Gravida Para Term Preterm AB Living   0             SAB TAB Ectopic Multiple Live Births                 Review of Systems  Constitutional: Positive for activity change and appetite change. Negative for chills, fatigue and fever.  HENT: Positive for congestion, postnasal drip, rhinorrhea and sore throat. Negative for facial swelling.   Eyes: Negative.   Respiratory: Positive for cough. Negative for shortness of breath.   Cardiovascular: Negative.   Musculoskeletal: Negative for neck pain and neck stiffness.  Skin: Negative for pallor and rash.  Neurological: Negative.     Allergies  Patient has no known allergies.  Home Medications   Prior to Admission medications   Medication Sig Start Date End Date Taking? Authorizing Provider  ibuprofen (ADVIL,MOTRIN) 600 MG tablet Take 1 tablet (600 mg total) by mouth every 6 (six) hours as  needed. 09/03/16   Hayden Rasmussen, NP  oseltamivir (TAMIFLU) 75 MG capsule Take 1 capsule (75 mg total) by mouth 2 (two) times daily. X 5 days 09/03/16   Hayden Rasmussen, NP   Meds Ordered and Administered this Visit  Medications - No data to display  BP (!) 151/116 (BP Location: Right Arm)   Pulse 80   Temp 99.1 F (37.3 C) (Oral)   Resp 16   LMP 08/26/2016   SpO2 99%  No data found.   Physical Exam  Constitutional: She is oriented to person, place, and time. She appears well-developed and well-nourished. No distress.  HENT:  Right Ear: External ear normal.  Left Ear: External ear normal.  Oropharynx with minor erythema and clear PND. No exudates. Bilateral TMs are clear, normal.  Eyes: EOM are normal. Pupils are equal, round, and reactive to light.  Neck: Normal range of motion. Neck supple.  Cardiovascular: Normal rate, regular rhythm and normal heart sounds.   Pulmonary/Chest: Effort normal and breath sounds normal. No respiratory distress. She has no wheezes. She has no rales.  Musculoskeletal: Normal range of motion. She exhibits no edema.  Lymphadenopathy:    She has no cervical adenopathy.  Neurological: She is alert and oriented to person, place, and time.  Skin: Skin is warm and dry.  Nursing note  and vitals reviewed.   Urgent Care Course     Procedures (including critical care time)  Labs Review Labs Reviewed - No data to display  Imaging Review No results found.   Visual Acuity Review  Right Eye Distance:   Left Eye Distance:   Bilateral Distance:    Right Eye Near:   Left Eye Near:    Bilateral Near:         MDM   1. Influenza-like illness   2. Elevated blood pressure reading    Sudafed PE 10 mg every 4 to 6 hours as needed for congestion Allegra or Zyrtec daily as needed for drainage and runny nose. For stronger antihistamine may take Chlor-Trimeton 2 to 4 mg every 4 to 6 hours, may cause drowsiness. Saline nasal spray used  frequently. Ibuprofen 600 mg every 6 hours as needed for pain, discomfort or fever. Drink plenty of fluids and stay well-hydrated. You need to see a doctor about your high blood pressure Meds ordered this encounter  Medications  . oseltamivir (TAMIFLU) 75 MG capsule    Sig: Take 1 capsule (75 mg total) by mouth 2 (two) times daily. X 5 days    Dispense:  10 capsule    Refill:  0    Order Specific Question:   Supervising Provider    Answer:   Eustace MooreMURRAY, LAURA W [[id number]]  . ibuprofen (ADVIL,MOTRIN) 600 MG tablet    Sig: Take 1 tablet (600 mg total) by mouth every 6 (six) hours as needed.    Dispense:  30 tablet    Refill:  0    Order Specific Question:   Supervising Provider    Answer:   Eustace MooreMURRAY, LAURA W [[id number]]       Hayden Rasmussenavid Siddh Vandeventer, NP 09/03/16 2129

## 2016-09-03 NOTE — ED Triage Notes (Signed)
Cough     And body  Aches       X  1   Week     Body  Aches   Began    Today       Chills  As   Well      Symptoms   Not  releived  By  otc  meds      Has  Had  High  bp    But  Is  Not   On  Any     meds

## 2016-09-03 NOTE — Discharge Instructions (Signed)
Sudafed PE 10 mg every 4 to 6 hours as needed for congestion Allegra or Zyrtec daily as needed for drainage and runny nose. For stronger antihistamine may take Chlor-Trimeton 2 to 4 mg every 4 to 6 hours, may cause drowsiness. Saline nasal spray used frequently. Ibuprofen 600 mg every 6 hours as needed for pain, discomfort or fever. Drink plenty of fluids and stay well-hydrated. You need to see a doctor about your high blood pressure

## 2017-06-21 ENCOUNTER — Encounter: Payer: Self-pay | Admitting: Physician Assistant

## 2017-06-21 ENCOUNTER — Ambulatory Visit: Payer: Self-pay | Admitting: Physician Assistant

## 2017-06-21 ENCOUNTER — Other Ambulatory Visit: Payer: Self-pay

## 2017-06-21 VITALS — BP 138/86 | HR 70 | Temp 98.3°F | Resp 18 | Ht 66.0 in | Wt 199.6 lb

## 2017-06-21 DIAGNOSIS — T733XXA Exhaustion due to excessive exertion, initial encounter: Secondary | ICD-10-CM
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
## 2017-07-31 ENCOUNTER — Encounter: Payer: Self-pay | Admitting: Physician Assistant

## 2017-09-12 ENCOUNTER — Encounter (HOSPITAL_COMMUNITY): Payer: Self-pay

## 2017-09-12 ENCOUNTER — Other Ambulatory Visit: Payer: Self-pay

## 2017-09-12 ENCOUNTER — Emergency Department (HOSPITAL_COMMUNITY): Payer: 59

## 2017-09-12 ENCOUNTER — Emergency Department (HOSPITAL_COMMUNITY)
Admission: EM | Admit: 2017-09-12 | Discharge: 2017-09-12 | Disposition: A | Discharge status: Home / Self Care | Payer: 59 | Attending: Emergency Medicine | Admitting: Emergency Medicine

## 2017-09-12 DIAGNOSIS — Z87891 Personal history of nicotine dependence: Secondary | ICD-10-CM | POA: Insufficient documentation

## 2017-09-12 DIAGNOSIS — Z79899 Other long term (current) drug therapy: Secondary | ICD-10-CM | POA: Diagnosis not present

## 2017-09-12 DIAGNOSIS — R079 Chest pain, unspecified: Secondary | ICD-10-CM | POA: Insufficient documentation

## 2017-09-12 DIAGNOSIS — I10 Essential (primary) hypertension: Secondary | ICD-10-CM | POA: Diagnosis not present

## 2017-09-12 DIAGNOSIS — R0602 Shortness of breath: Secondary | ICD-10-CM | POA: Diagnosis present

## 2017-09-12 DIAGNOSIS — J45909 Unspecified asthma, uncomplicated: Secondary | ICD-10-CM | POA: Diagnosis not present

## 2017-09-12 LAB — CBC
HEMATOCRIT: 37.8 % (ref 36.0–46.0)
HEMOGLOBIN: 12.8 g/dL (ref 12.0–15.0)
MCH: 33.1 pg (ref 26.0–34.0)
MCHC: 33.9 g/dL (ref 30.0–36.0)
MCV: 97.7 fL (ref 78.0–100.0)
Platelets: 269 10*3/uL (ref 150–400)
RBC: 3.87 MIL/uL (ref 3.87–5.11)
RDW: 11.8 % (ref 11.5–15.5)
WBC: 8 10*3/uL (ref 4.0–10.5)

## 2017-09-12 LAB — I-STAT TROPONIN, ED: Troponin i, poc: 0 ng/mL (ref 0.00–0.08)

## 2017-09-12 LAB — BASIC METABOLIC PANEL
ANION GAP: 8 (ref 5–15)
BUN: 9 mg/dL (ref 6–20)
CALCIUM: 9.4 mg/dL (ref 8.9–10.3)
CO2: 21 mmol/L — AB (ref 22–32)
Chloride: 109 mmol/L (ref 101–111)
Creatinine, Ser: 0.85 mg/dL (ref 0.44–1.00)
Glucose, Bld: 104 mg/dL — ABNORMAL HIGH (ref 65–99)
POTASSIUM: 4.1 mmol/L (ref 3.5–5.1)
Sodium: 138 mmol/L (ref 135–145)

## 2017-09-12 LAB — I-STAT BETA HCG BLOOD, ED (MC, WL, AP ONLY)

## 2017-09-12 LAB — D-DIMER, QUANTITATIVE: D-Dimer, Quant: <0.27 ug/mL-FEU (ref 0.00–0.50)

## 2017-09-12 MED ORDER — OXYCODONE-ACETAMINOPHEN 5-325 MG PO TABS
1.0000 | ORAL_TABLET | Freq: Once | ORAL | Status: AC
  Administered 2017-09-12: 1 via ORAL
  Filled 2017-09-12: qty 1

## 2017-09-12 MED ORDER — TRAMADOL HCL 50 MG PO TABS: 50.0000 mg | ORAL_TABLET | Freq: Four times a day (QID) | ORAL | 0 refills | Status: DC | PRN

## 2017-09-12 MED ORDER — KETOROLAC TROMETHAMINE 30 MG/ML IJ SOLN
30.0000 mg | Freq: Once | INTRAMUSCULAR | Status: AC
  Administered 2017-09-12: 30 mg via INTRAVENOUS
  Filled 2017-09-12: qty 1

## 2017-09-12 MED ORDER — AMLODIPINE BESYLATE 5 MG PO TABS: 5.0000 mg | ORAL_TABLET | Freq: Every day | ORAL | 0 refills | Status: DC

## 2017-09-12 MED ORDER — FENTANYL CITRATE (PF) 100 MCG/2ML IJ SOLN
50.0000 ug | Freq: Once | INTRAMUSCULAR | Status: AC
  Administered 2017-09-12: 50 ug via INTRAVENOUS
  Filled 2017-09-12: qty 2

## 2017-09-12 NOTE — ED Triage Notes (Signed)
Patient c/o intermittent.left chest pain and SOB since yesterday. Patient states left chest pain worse with a deep breath.

## 2017-09-12 NOTE — Discharge Instructions (Signed)
Follow-up in 2-3 days for recheck of your blood pressure.

## 2017-09-12 NOTE — ED Notes (Signed)
Pt is alert and oriented x 4 and is verbally responsive. Pt reports left sided chest pain that worsens with deep breathing. Pt reports that pain began yesterday 8/10 burning/sharp pain.

## 2017-09-12 NOTE — ED Provider Notes (Signed)
[facility]-EMERGENCY DEPT Provider Note   CSN: [id number] Arrival date & time: 09/12/17  [id number]     History   Chief Complaint Chief Complaint  Patient presents with  . Chest Pain  . Shortness of Breath  . Hypertension    HPI Amber Wells is a 27 y.o. female.  HPI Patient presents with shortness of breath and chest pain.  Has had since yesterday.  Has somewhat come and gone.  Pain in her left upper chest.  States it feels somewhat like her asthma but there is no wheezing.  Feels that there is asthma only in the left chest.  No real shortness of breath but feels pain with the breathing and feels it is more difficult to breathe because of the pain.  No swelling in her legs.  No fevers.  No abdominal pain.  Denies possibly pregnancy. Past Medical History:  Diagnosis Date  . Asthma   . Bronchitis   . Constipation   . Smoker     There are no active problems to display for this patient.   Past Surgical History:  Procedure Laterality Date  . NO PAST SURGERIES      OB History    Gravida Para Term Preterm AB Living   0             SAB TAB Ectopic Multiple Live Births                   Home Medications    Prior to Admission medications   Medication Sig Start Date End Date Taking? Authorizing Provider  naproxen sodium (ALEVE) 220 MG tablet Take 220 mg by mouth 2 (two) times daily as needed (pain).   Yes [provider]  amLODipine (NORVASC) 5 MG tablet Take 1 tablet (5 mg total) by mouth daily. 09/12/17   Benjiman CorePickering, Melodie Ashworth, MD  ibuprofen (ADVIL,MOTRIN) 600 MG tablet Take 1 tablet (600 mg total) by mouth every 6 (six) hours as needed. Patient not taking: Reported on 06/21/2017 09/03/16   Hayden RasmussenMabe, David, NP  oseltamivir (TAMIFLU) 75 MG capsule Take 1 capsule (75 mg total) by mouth 2 (two) times daily. X 5 days Patient not taking: Reported on 06/21/2017 09/03/16   Hayden RasmussenMabe, David, NP  traMADol (ULTRAM) 50 MG tablet Take 1 tablet (50 mg total) by mouth every  6 (six) hours as needed. 09/12/17   Benjiman CorePickering, Brooklen Runquist, MD    Family History Family History  Problem Relation Age of Onset  . Hypertension Mother   . Diabetes Paternal Uncle     Social History Social History   Tobacco Use  . Smoking status: Former Smoker    Packs/day: 0.25  . Smokeless tobacco: Never Used  Substance Use Topics  . Alcohol use: Yes    Comment: socially  . Drug use: Yes    Frequency: 2.0 times per week    Types: Marijuana    Comment: twice a day.     Allergies   Patient has no known allergies.   Review of Systems Review of Systems  Constitutional: Negative for appetite change and fever.  HENT: Negative for congestion.   Respiratory: Negative for chest tightness and shortness of breath.   Cardiovascular: Positive for chest pain. Negative for leg swelling.  Gastrointestinal: Negative for abdominal pain.  Genitourinary: Negative for dysuria and frequency.  Musculoskeletal: Negative for back pain.  Neurological: Negative for headaches.  Hematological: Negative for adenopathy.  Psychiatric/Behavioral: Negative for behavioral problems.     Physical Exam Updated  Vital Signs BP (!) 168/112   Pulse 79   Temp 98 F (36.7 C) (Oral)   Resp (!) 22   Ht 5' 6.25" (1.683 m)   Wt 90.7 kg (200 lb)   LMP 08/27/2017 (Approximate)   SpO2 99%   BMI 32.04 kg/m    Physical Exam  Constitutional: She appears well-developed.  Patient appears uncomfortable  HENT:  Head: Normocephalic.  Neck: Neck supple.  Cardiovascular: Regular rhythm. Exam reveals no friction rub.  No murmur heard. Pulmonary/Chest: Effort normal.  Tenderness to left upper chest wall and left lateral chest wall.  No crepitance or deformity.  No rash.  No subcu emphysema.  Musculoskeletal:       Right lower leg: She exhibits no edema.       Left lower leg: She exhibits no edema.  Neurological: She is alert.  Skin: Skin is warm. Capillary refill takes less than 2 seconds.     ED Treatments  / Results  Labs (all labs ordered are listed, but only abnormal results are displayed) Labs Reviewed  BASIC METABOLIC PANEL - Abnormal; Notable for the following components:      Result Value   CO2 21 (*)    Glucose, Bld 104 (*)    All other components within normal limits  CBC  D-DIMER, QUANTITATIVE (NOT AT [facility])  I-STAT TROPONIN, ED  I-STAT BETA HCG BLOOD, ED ([facility], [facility], [facility] ONLY)    EKG  EKG Interpretation  Date/Time:  Tuesday September 12 2017 09:25:26 EST Ventricular Rate:  84 PR Interval:    QRS Duration: 84 QT Interval:  396 QTC Calculation: 469 R Axis:   71 Text Interpretation:  Sinus rhythm LVH by voltage Baseline wander in lead(s) II V3 V4 V5 Confirmed by Benjiman Core [id number]) on 09/12/2017 10:02:05 AM       Radiology Dg Chest 2 View  Result Date: 09/12/2017 CLINICAL DATA:  Cough and congestion. EXAM: CHEST  2 VIEW COMPARISON:  Chest x-ray 01/26/2016. FINDINGS: Mediastinum hilar structures are normal. Given technique heart size stable. Lungs are clear. No pleural effusion or pneumothorax. No acute bony abnormality. IMPRESSION: No acute pulmonary disease.  No focal infiltrate. Electronically Signed   By: Maisie Fus  Register   On: 09/12/2017 10:24    Procedures Procedures (including critical care time)  Medications Ordered in ED Medications  ketorolac (TORADOL) 30 MG/ML injection 30 mg (30 mg Intravenous Given 09/12/17 1225)  fentaNYL (SUBLIMAZE) injection 50 mcg (50 mcg Intravenous Given 09/12/17 1225)  oxyCODONE-acetaminophen (PERCOCET/ROXICET) 5-325 MG per tablet 1 tablet (1 tablet Oral Given 09/12/17 1350)     Initial Impression / Assessment and Plan / ED Course  I have reviewed the triage vital signs and the nursing notes.  Pertinent labs & imaging results that were available during my care of the patient were reviewed by me and considered in my medical decision making (see chart for details).     Patient presents with chest pain.  Worse with deep breathing.   Worse with palpitations and movement.  Has been there since yesterday.  In upper chest.  Pain is coming on at times.  No swelling in her legs.  Found to be hypertensive here.  No history of hypertension but reportedly strong family history.  D-dimer negative.  EKG x-ray and lab work reassuring.  With the level of hypertension will empirically treat.  Will have follow-up with her primary care doctor in 2-3 days.  Final Clinical Impressions(s) / ED Diagnoses   Final diagnoses:  Hypertension, unspecified  type  Chest pain, unspecified type    ED Discharge Orders        Ordered    amLODipine (NORVASC) 5 MG tablet  Daily     09/12/17 1407    traMADol (ULTRAM) 50 MG tablet  Every 6 hours PRN     09/12/17 1408      Benjiman Core, MD 09/12/17 1417

## 2017-09-14 ENCOUNTER — Telehealth: Payer: Self-pay | Admitting: Physician Assistant

## 2017-09-14 ENCOUNTER — Encounter: Payer: Self-pay | Admitting: Physician Assistant

## 2017-09-14 ENCOUNTER — Ambulatory Visit: Payer: PRIVATE HEALTH INSURANCE | Admitting: Physician Assistant

## 2017-09-14 ENCOUNTER — Other Ambulatory Visit: Payer: Self-pay

## 2017-09-14 VITALS — BP 123/86 | HR 77 | Temp 98.5°F | Ht 66.25 in | Wt 206.2 lb

## 2017-09-14 DIAGNOSIS — R42 Dizziness and giddiness: Secondary | ICD-10-CM

## 2017-09-14 DIAGNOSIS — R0789 Other chest pain: Secondary | ICD-10-CM

## 2017-09-14 MED ORDER — OMEPRAZOLE 20 MG PO CPDR: 20.0000 mg | DELAYED_RELEASE_CAPSULE | Freq: Every day | ORAL | 3 refills | Status: DC

## 2017-09-14 NOTE — Progress Notes (Deleted)
[facility] AT [facility] [address], [place] [address] [phone]  Date:  09/14/2017   Name:  Amber Wells   DOB:  19-Apr-1991   MRN:  [id number]  PCP:  Garnetta BuddyEnglish, Stephanie D, PA    History of Present Illness:  Amber Wells is a 27 y.o. female patient who presents to PCP with  Chief Complaint  Patient presents with  . Follow-up        There are no active problems to display for this patient.   Past Medical History:  Diagnosis Date  . Asthma   . Bronchitis   . Constipation   . Smoker     Past Surgical History:  Procedure Laterality Date  . NO PAST SURGERIES      Social History   Tobacco Use  . Smoking status: Former Smoker    Packs/day: 0.25  . Smokeless tobacco: Never Used  Substance Use Topics  . Alcohol use: Yes    Comment: socially  . Drug use: Yes    Frequency: 2.0 times per week    Types: Marijuana    Comment: twice a day.    Family History  Problem Relation Age of Onset  . Hypertension Mother   . Diabetes Paternal Uncle     No Known Allergies  Medication list has been reviewed and updated.  Current Outpatient Medications on File Prior to Visit  Medication Sig Dispense Refill  . traMADol (ULTRAM) 50 MG tablet Take 1 tablet (50 mg total) by mouth every 6 (six) hours as needed. 6 tablet 0  . amLODipine (NORVASC) 5 MG tablet Take 1 tablet (5 mg total) by mouth daily. 30 tablet 0  . ibuprofen (ADVIL,MOTRIN) 600 MG tablet Take 1 tablet (600 mg total) by mouth every 6 (six) hours as needed. (Patient not taking: Reported on 06/21/2017) 30 tablet 0  . naproxen sodium (ALEVE) 220 MG tablet Take 220 mg by mouth 2 (two) times daily as needed (pain).    Marland Kitchen. oseltamivir (TAMIFLU) 75 MG capsule Take 1 capsule (75 mg total) by mouth 2 (two) times daily. X 5 days (Patient not taking: Reported on 06/21/2017) 10 capsule 0   No current facility-administered medications on file prior to visit.     ROS ROS otherwise unremarkable unless listed  above.  Physical Examination: BP 123/86   Pulse 77   Temp 98.5 F (36.9 C)   Ht 5' 6.25" (1.683 m)   Wt 206 lb 3.2 oz (93.5 kg)   LMP 08/27/2017 (Approximate)   BMI 33.03 kg/m  Ideal Body Weight: Weight in (lb) to have BMI = 25: 155.7  Physical Exam   Assessment and Plan: Amber Wells is a 27 y.o. female who is here today  There are no diagnoses linked to this encounter.  Trena PlattStephanie English, PA-C [facility] 09/14/2017 8:51 AM

## 2017-09-14 NOTE — Telephone Encounter (Signed)
Incoming call from patient call center. Patient is calling to let office know omeprazole was sent to wrong pharmacy - [facility] [address].   [facility] to [facility] [address], spoke with pharmacist. Omeprazole 20mg  called in.   Phone call to patient. Not available at time of call. If patient calls back, please let her know omeprazole 20mg  [facility] on [address]

## 2017-09-14 NOTE — Progress Notes (Signed)
PRIMARY CARE AT [facility] [address] [place] [address] [phone]  Date:  09/14/2017   Name:  Amber Wells   DOB:  03/17/1991   MRN:  [id number]  PCP:  Amber Buddy, PA    History of Present Illness:  Amber Wells is a 27 y.o. female patient who presents to PCP with  Chief Complaint  Patient presents with  . Follow-up     The pain 8/10 at the hospital  She took tums, and the next morning--she went to pick a shirt, and felt burning sensation around her chest.  There was pressure around her heart.  The sensation lasted for over 24 hours, pain was wax and waning.  Deep breaths would aggravate the pain.  No black or bloody stool.  She will have some lightheadedness only when standing too fast.  She feels that the pain is returning.  Rates 3/10.  She was at rest when it reoccurred today about 2 hours ago.  No nausea or sour taste.  No diaphoresis or dizziness.  She had canned lasagna at the night prior to her having the sensation.   Last visit to ophthalmology was 1 year ago.  States she has blurriness only after looking at the tv and coming away from it.   29 year old MGF heart attack--reports she was an alcoholic.   Cannabis--3-4 times per day.    There are no active problems to display for this patient.   Past Medical History:  Diagnosis Date  . Asthma   . Bronchitis   . Constipation   . Smoker     Past Surgical History:  Procedure Laterality Date  . NO PAST SURGERIES      Social History   Tobacco Use  . Smoking status: Former Smoker    Packs/day: 0.25  . Smokeless tobacco: Never Used  Substance Use Topics  . Alcohol use: Yes    Comment: socially  . Drug use: Yes    Frequency: 2.0 times per week    Types: Marijuana    Comment: twice a day.    Family History  Problem Relation Age of Onset  . Hypertension Mother   . Diabetes Paternal Uncle     No Known Allergies  Medication list has been reviewed and updated.  Current Outpatient Medications on  File Prior to Visit  Medication Sig Dispense Refill  . traMADol (ULTRAM) 50 MG tablet Take 1 tablet (50 mg total) by mouth every 6 (six) hours as needed. 6 tablet 0  . amLODipine (NORVASC) 5 MG tablet Take 1 tablet (5 mg total) by mouth daily. 30 tablet 0  . ibuprofen (ADVIL,MOTRIN) 600 MG tablet Take 1 tablet (600 mg total) by mouth every 6 (six) hours as needed. (Patient not taking: Reported on 06/21/2017) 30 tablet 0  . naproxen sodium (ALEVE) 220 MG tablet Take 220 mg by mouth 2 (two) times daily as needed (pain).    Marland Kitchen oseltamivir (TAMIFLU) 75 MG capsule Take 1 capsule (75 mg total) by mouth 2 (two) times daily. X 5 days (Patient not taking: Reported on 06/21/2017) 10 capsule 0   No current facility-administered medications on file prior to visit.     ROS ROS otherwise unremarkable unless listed above.  Physical Examination: BP 123/86   Pulse 77   Temp 98.5 F (36.9 C)   Ht 5' 6.25" (1.683 m)   Wt 206 lb 3.2 oz (93.5 kg)   LMP 08/27/2017 (Approximate)   BMI 33.03 kg/m  Ideal Body  Weight: Weight in (lb) to have BMI = 25: 155.7  Physical Exam  Constitutional: She is oriented to person, place, and time. She appears well-developed and well-nourished. No distress.  HENT:  Head: Normocephalic and atraumatic.  Right Ear: External ear normal.  Left Ear: External ear normal.  Eyes: Conjunctivae and EOM are normal. Pupils are equal, round, and reactive to light.  Cardiovascular: Normal rate and regular rhythm.  No murmur heard. Pulmonary/Chest: Effort normal. No respiratory distress.  Neurological: She is alert and oriented to person, place, and time.  Skin: She is not diaphoretic.  Psychiatric: She has a normal mood and affect. Her behavior is normal.     Assessment and Plan: Amber Wells is a 27 y.o. female who is here today for cc of  Chief Complaint  Patient presents with  . Follow-up  will refer to cardiology at this time for clearance. Given symptoms and family  history. gerd reflux diet given.  Omeprazole given. Other chest pain - Plan: TSH, EKG 12-Lead, Orthostatic vital signs, Ambulatory referral to Cardiology, omeprazole (PRILOSEC) 20 MG capsule  Light headed - Plan: EKG 12-Lead, Orthostatic vital signs, Ambulatory referral to Cardiology  Amber PlattStephanie English, PA-C [facility] 3/1/201912:50 PM

## 2017-09-14 NOTE — Patient Instructions (Addendum)
  I am referring you to a cardiologist.  Please make sure that you make this appointment.  Do not engage in strenuous activity until you are cleared by a cardiologist.  I am going to put you on a gerd diet.  This is to rule out heartburn and also treat this, if this were the culprit.  Take the medication as prescribed, a minimum of 2 weeks.   Continue to take the amlodipine at this time.    Food Choices for Gastroesophageal Reflux Disease, Adult When you have gastroesophageal reflux disease (GERD), the foods you eat and your eating habits are very important. Choosing the right foods can help ease your discomfort. What guidelines do I need to follow?  Choose fruits, vegetables, whole grains, and low-fat dairy products.  Choose low-fat meat, fish, and poultry.  Limit fats such as oils, salad dressings, butter, nuts, and avocado.  Keep a food diary. This helps you identify foods that cause symptoms.  Avoid foods that cause symptoms. These may be different for everyone.  Eat small meals often instead of 3 large meals a day.  Eat your meals slowly, in a place where you are relaxed.  Limit fried foods.  Cook foods using methods other than frying.  Avoid drinking alcohol.  Avoid drinking large amounts of liquids with your meals.  Avoid bending over or lying down until 2-3 hours after eating. What foods are not recommended? These are some foods and drinks that may make your symptoms worse: Vegetables Tomatoes. Tomato juice. Tomato and spaghetti sauce. Chili peppers. Onion and garlic. Horseradish. Fruits Oranges, grapefruit, and lemon (fruit and juice). Meats High-fat meats, fish, and poultry. This includes hot dogs, ribs, ham, sausage, salami, and bacon. Dairy Whole milk and chocolate milk. Sour cream. Cream. Butter. Ice cream. Cream cheese. Drinks Coffee and tea. Bubbly (carbonated) drinks or energy drinks. Condiments Hot sauce. Barbecue sauce. Sweets/Desserts Chocolate and  cocoa. Donuts. Peppermint and spearmint. Fats and Oils High-fat foods. This includes [place] fries and potato chips. Other Vinegar. Strong spices. This includes black pepper, white pepper, red pepper, cayenne, curry powder, cloves, ginger, and chili powder. The items listed above may not be a complete list of foods and drinks to avoid. Contact your dietitian for more information. This information is not intended to replace advice given to you by your health care provider. Make sure you discuss any questions you have with your health care provider. Document Released: 01/10/2012 Document Revised: 12/17/2015 Document Reviewed: 05/15/2013 Elsevier Interactive Patient Education  2017 [employer]    IF you received an x-ray today, you will receive an invoice from [facility]. Please contact [facility] at [phone] with questions or concerns regarding your invoice.   IF you received labwork today, you will receive an invoice from [facility]. Please contact [facility] at [phone] with questions or concerns regarding your invoice.   Our billing staff will not be able to assist you with questions regarding bills from these companies.  You will be contacted with the lab results as soon as they are available. The fastest way to get your results is to activate your My Chart account. Instructions are located on the last page of this paperwork. If you have not heard from us regarding the results in 2 weeks, please contact this office.

## 2017-09-15 LAB — TSH: TSH: 0.985 u[IU]/mL (ref 0.450–4.500)

## 2017-09-20 ENCOUNTER — Ambulatory Visit: Payer: Self-pay | Admitting: *Deleted

## 2017-09-20 NOTE — Telephone Encounter (Signed)
Pt states she has experienced elevated BPs with the most recent BP being 148/112 and pulse of 107. Pt states her BPs over the past week have been in the range of 140s-150s. Pt does not have any complaints of dizziness or any symptoms at this time. Appt made for the pt to be seen on 3/4 to have evaluation of BP. Advised pt that if symptoms worsened or if she noticed that her BP continued to increase before seen for appt to call office back.    Reason for Disposition . [1] Systolic BP  >= 140 OR Diastolic >= 90 AND [2] taking BP medications  Answer Assessment - Initial Assessment Questions 1. BLOOD PRESSURE: "What is the blood pressure?" "Did you take at least two measurements 5 minutes apart?"     148/112 and pulse 107 2. ONSET: "When did you take your blood pressure?"     While on the call with agent before transferring to nurse 3. HOW: "How did you obtain the blood pressure?" (e.g., visiting nurse, automatic home BP monitor)     Home BP monitor 4. HISTORY: "Do you have a history of high blood pressure?"     Yes 5. MEDICATIONS: "Are you taking any medications for blood pressure?" "Have you missed any doses recently?"     Yes, pt is taking Amlodipine and has not missed any doses recently.  6. OTHER SYMPTOMS: "Do you have any symptoms?" (e.g., headache, chest pain, blurred vision, difficulty breathing, weakness)     Pt states she has a headache every now and then  7. PREGNANCY: "Is there any chance you are pregnant?" "When was your last menstrual period?"     Not assessed  Protocols used: HIGH BLOOD PRESSURE-A-AH

## 2017-09-25 ENCOUNTER — Ambulatory Visit: Payer: PRIVATE HEALTH INSURANCE | Admitting: Physician Assistant

## 2017-09-25 VITALS — BP 132/84 | HR 67 | Temp 98.2°F | Resp 16 | Ht 66.0 in | Wt 199.0 lb

## 2017-09-25 DIAGNOSIS — R0789 Other chest pain: Secondary | ICD-10-CM | POA: Diagnosis not present

## 2017-09-25 NOTE — Progress Notes (Signed)
[facility] [address], [place] [address] [phone]  Date:  09/25/2017   Name:  Amber Wells   DOB:  Aug 13, 1990   MRN:  [id number]  PCP:  Garnetta BuddyEnglish, Stephanie D, PA    History of Present Illness:  Amber Wells is a 27 y.o. female patient who presents to PCP with  Chief Complaint  Patient presents with  . Hypertension  . Medication Refill    AMLOPINE     She has chest pain, this is a burning sensation at the center of her chest.  She will also have this when she feels stressed.  This is the left side of chest pain.  She hydrates well with water.  She will still feel the sensation.  No sob.  No nausea with this.  No numbness or tingling.  She had blurry vision directly after.  The pain will last for 5-10 minutes. No et OH.  MGF passed in 30's from MI.  Longstanding etOH addiction.  Possible additional substance addictions. She has found that the chest pain has improved with omeprazole when she came in 11 days ago about chest pain.  Due to chest pain and family hx, cardiology consult was placed.  This was not for several weeks.   There are no active problems to display for this patient.   Past Medical History:  Diagnosis Date  . Asthma   . Bronchitis   . Constipation   . Smoker     Past Surgical History:  Procedure Laterality Date  . NO PAST SURGERIES      Social History   Tobacco Use  . Smoking status: Former Smoker    Packs/day: 0.25  . Smokeless tobacco: Never Used  Substance Use Topics  . Alcohol use: Yes    Comment: socially  . Drug use: Yes    Frequency: 2.0 times per week    Types: Marijuana    Comment: twice a day.    Family History  Problem Relation Age of Onset  . Hypertension Mother   . Diabetes Paternal Uncle     No Known Allergies  Medication list has been reviewed and updated.  Current Outpatient Medications on File Prior to Visit  Medication Sig Dispense Refill  . amLODipine (NORVASC) 5 MG tablet Take 1 tablet (5 mg total) by  mouth daily. 30 tablet 0  . naproxen sodium (ALEVE) 220 MG tablet Take 220 mg by mouth 2 (two) times daily as needed (pain).    Marland Kitchen. omeprazole (PRILOSEC) 20 MG capsule Take 1 capsule (20 mg total) by mouth daily. 30 capsule 3  . traMADol (ULTRAM) 50 MG tablet Take 1 tablet (50 mg total) by mouth every 6 (six) hours as needed. 6 tablet 0  . ibuprofen (ADVIL,MOTRIN) 600 MG tablet Take 1 tablet (600 mg total) by mouth every 6 (six) hours as needed. (Patient not taking: Reported on 06/21/2017) 30 tablet 0  . oseltamivir (TAMIFLU) 75 MG capsule Take 1 capsule (75 mg total) by mouth 2 (two) times daily. X 5 days (Patient not taking: Reported on 06/21/2017) 10 capsule 0   No current facility-administered medications on file prior to visit.     ROS ROS otherwise unremarkable unless listed above.  Physical Examination: BP 137/87   Pulse 67   Temp 98.2 F (36.8 C) (Oral)   Resp 16   Ht 5\' 6"  (1.676 m)   Wt 199 lb (90.3 kg)   LMP 08/27/2017 (Approximate)   BMI 32.12 kg/m  Ideal Body  Weight: Weight in (lb) to have BMI = 25: 154.6  Physical Exam  Constitutional: She is oriented to person, place, and time. She appears well-developed and well-nourished. No distress.  HENT:  Head: Normocephalic and atraumatic.  Right Ear: External ear normal.  Left Ear: External ear normal.  Eyes: Conjunctivae and EOM are normal. Pupils are equal, round, and reactive to light.  Cardiovascular: Normal rate, regular rhythm, normal heart sounds and intact distal pulses. Exam reveals no friction rub.  No murmur heard. Pulmonary/Chest: Effort normal and breath sounds normal. No respiratory distress.  Neurological: She is alert and oriented to person, place, and time.  Skin: She is not diaphoretic.  Psychiatric: She has a normal mood and affect. Her behavior is normal.     Assessment and Plan: Amber Wells is a 27 y.o. female who is here today for cc of  Chief Complaint  Patient presents with  . Hypertension  .  Medication Refill    AMLOPINE   Will follow up with cardiologist as planned. Other chest pain  Trena Platt, PA-C [facility] 3/12/20191:06 PM

## 2017-09-25 NOTE — Patient Instructions (Addendum)
Cardiology Appointment 11:00 AM Thursday 09/28/2017 Please make sure that you make this appointment.   IF you received an x-ray today, you will receive an invoice from [facility]. Please contact [facility] at [phone] with questions or concerns regarding your invoice.   IF you received labwork today, you will receive an invoice from [facility]. Please contact [facility] at [phone] with questions or concerns regarding your invoice.   Our billing staff will not be able to assist you with questions regarding bills from these companies.  You will be contacted with the lab results as soon as they are available. The fastest way to get your results is to activate your My Chart account. Instructions are located on the last page of this paperwork. If you have not heard from us regarding the results in 2 weeks, please contact this office.

## 2017-09-27 NOTE — Progress Notes (Signed)
Spoke with pt regarding lab.

## 2017-09-28 ENCOUNTER — Encounter: Payer: Self-pay | Admitting: Physician Assistant

## 2017-09-28 ENCOUNTER — Ambulatory Visit: Payer: PRIVATE HEALTH INSURANCE | Admitting: Physician Assistant

## 2017-09-28 VITALS — BP 138/92 | HR 64 | Ht 66.0 in | Wt 206.0 lb

## 2017-09-28 DIAGNOSIS — I10 Essential (primary) hypertension: Secondary | ICD-10-CM | POA: Diagnosis not present

## 2017-09-28 DIAGNOSIS — R079 Chest pain, unspecified: Secondary | ICD-10-CM

## 2017-09-28 NOTE — Patient Instructions (Signed)
Take Ibuprofen 600 mg three times a day with food for 2 weeks then stop     Your physician recommends that you schedule a follow-up appointment in: 3 months with Dr.Croitoru.

## 2017-09-28 NOTE — Progress Notes (Signed)
Cardiology Office Note    Date:  09/29/2017   ID:  Amber Wells, DOB 01/09/1991, MRN [id number]  PCP:  Garnetta Buddy, PA  Cardiologist:  New - case discussed with DOD Dr. Royann Shivers  Chief Complaint  Patient presents with  . New Patient (Initial Visit)    eval for chest pain, referred by PCP Trena Platt. Case discussed with Dr. Royann Shivers    History of Present Illness:  Amber Wells is a 27 y.o. female with recently diagnosed hypertension who is being referred by her primary care provider for evaluation of chest pain.  She was seen in the ED on 09/12/2017 with high blood pressure and hypertension.  She was placed on amlodipine.  She her chest pain was felt to be atypical. Her chest pain has been intermittent for the past 3 weeks. Although she has distant relatives who have coronary artery disease, both of her parents only have high blood pressure.  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  Her chest pain is worse with deep inspiration.  Her chest pain does not change when she lay down or sit up.  It occurs with multiple times throughout the day.  It is also worse when she is emotionally stressed.  She does not do much exercise and spend majority of her time working.      Past Medical History:  Diagnosis Date  . Asthma   . Bronchitis   . Constipation   . Smoker     Past Surgical History:  Procedure Laterality Date  . NO PAST SURGERIES      Current Medications: Outpatient Medications Prior to Visit  Medication Sig Dispense Refill  . amLODipine (NORVASC) 5 MG tablet Take 1 tablet (5 mg total) by mouth daily. 30 tablet 0  . ibuprofen (ADVIL,MOTRIN) 600 MG tablet Take 1 tablet (600 mg total) by mouth every 6 (six) hours as needed. 30 tablet 0  . naproxen sodium (ALEVE) 220 MG tablet Take 220 mg by mouth 2 (two) times daily as needed (pain).    Marland Kitchen omeprazole (PRILOSEC) 20 MG capsule Take 1 capsule (20 mg total) by mouth daily. 30 capsule 3  . oseltamivir (TAMIFLU)  75 MG capsule Take 1 capsule (75 mg total) by mouth 2 (two) times daily. X 5 days 10 capsule 0  . traMADol (ULTRAM) 50 MG tablet Take 1 tablet (50 mg total) by mouth every 6 (six) hours as needed. 6 tablet 0   No facility-administered medications prior to visit.      Allergies:   Patient has no known allergies.   Social History   Socioeconomic History  . Marital status: Single    Spouse name: None  . Number of children: None  . Years of education: None  . Highest education level: None  Social Needs  . Financial resource strain: None  . Food insecurity - worry: None  . Food insecurity - inability: None  . Transportation needs - medical: None  . Transportation needs - non-medical: None  Occupational History  . None  Tobacco Use  . Smoking status: Former Smoker    Packs/day: 0.25  . Smokeless tobacco: Never Used  . Tobacco comment: since age 32  Substance and Sexual Activity  . Alcohol use: Yes    Comment: socially  . Drug use: Yes    Frequency: 2.0 times per week    Types: Marijuana    Comment: twice a day.  Marland Kitchen Sexual activity: No    Birth control/protection: None  Other Topics Concern  . None  Social History Narrative  . None     Family History:  The patient's family history includes Diabetes in her paternal uncle; Heart attack in her maternal grandfather; Hypertension in her father, maternal grandmother, mother, paternal grandmother, and sister; Lupus in her sister.   ROS:   Please see the history of present illness.    ROS All other systems reviewed and are negative.   PHYSICAL EXAM:   VS:  BP (!) 138/92   Pulse 64   Ht 5\' 6"  (1.676 m)   Wt 206 lb (93.4 kg)   BMI 33.25 kg/m    GEN: Well nourished, well developed, in no acute distress  HEENT: normal  Neck: no JVD, carotid bruits, or masses Cardiac: RRR; no murmurs, rubs, or gallops,no edema  Respiratory:  clear to auscultation bilaterally, normal work of breathing GI: soft, nontender, nondistended, +  BS MS: no deformity or atrophy  Skin: warm and dry, no rash Neuro:  Alert and Oriented x 3, Strength and sensation are intact Psych: euthymic mood, full affect  Wt Readings from Last 3 Encounters:  09/28/17 206 lb (93.4 kg)  09/25/17 199 lb (90.3 kg)  09/14/17 206 lb 3.2 oz (93.5 kg)      Studies/Labs Reviewed:   EKG:  EKG is ordered today.  The ekg ordered today demonstrates NSR without significant ST-T wave changes  Recent Labs: 09/12/2017: BUN 9; Creatinine, Ser 0.85; Hemoglobin 12.8; Platelets 269; Potassium 4.1; Sodium 138 09/14/2017: TSH 0.985   Lipid Panel No results found for: CHOL, TRIG, HDL, CHOLHDL, VLDL, LDLCALC, LDLDIRECT  Additional studies/ records that were reviewed today include:   Recent EKG    ASSESSMENT:    1. Chest pain, unspecified type   2. Essential hypertension      PLAN:  In order of problems listed above:  1. Chest pain: Her chest pain is very atypical, cardiac risk factors include hypertension and a distant family history of CAD.  However her chest pain is worse with deep inspiration. It appears to be pleuritic in nature, I have discussed the case with the right eye Dr. Swaz[place], at this time I do not recommend any further workup.  Her chest pain does not worsen with exertion.  Suspicion for coronary dissection is relatively low.  I have recommended ibuprofen for 2 weeks  2. Hypertension: Amlodipine was added to her medical regimen treating recent ED visit.    Medication Adjustments/Labs and Tests Ordered: Current medicines are reviewed at length with the patient today.  Concerns regarding medicines are outlined above.  Medication changes, Labs and Tests ordered today are listed in the Patient Instructions below. Patient Instructions  Take Ibuprofen 600 mg three times a day with food for 2 weeks then stop     Your physician recommends that you schedule a follow-up appointment in: 3 months with Dr.Croitoru.     Ramond DialSigned, Jalea Bronaugh, [place]   09/29/2017 10:16 AM    [facility] [address], [place], [place]  [address] Phone: [phone]; Fax: [phone]

## 2017-09-29 ENCOUNTER — Encounter: Payer: Self-pay | Admitting: Physician Assistant

## 2017-09-29 NOTE — Progress Notes (Signed)
Thanks MCr 

## 2017-10-03 ENCOUNTER — Encounter: Payer: Self-pay | Admitting: Physician Assistant

## 2017-10-13 ENCOUNTER — Ambulatory Visit: Payer: PRIVATE HEALTH INSURANCE | Admitting: Cardiovascular Disease

## 2017-10-16 ENCOUNTER — Ambulatory Visit: Payer: PRIVATE HEALTH INSURANCE | Admitting: Physician Assistant

## 2017-10-16 NOTE — Progress Notes (Deleted)
3 week f/u essential htn and chest pain  HPI  4 review of systems  Past Medical History:  Diagnosis Date  . Asthma   . Bronchitis   . Constipation   . Smoker     Current Outpatient Medications  Medication Sig Dispense Refill  . amLODipine (NORVASC) 5 MG tablet Take 1 tablet (5 mg total) by mouth daily. 30 tablet 0  . ibuprofen (ADVIL,MOTRIN) 600 MG tablet Take 1 tablet (600 mg total) by mouth every 6 (six) hours as needed. 30 tablet 0  . naproxen sodium (ALEVE) 220 MG tablet Take 220 mg by mouth 2 (two) times daily as needed (pain).    Marland Kitchen omeprazole (PRILOSEC) 20 MG capsule Take 1 capsule (20 mg total) by mouth daily. 30 capsule 3  . oseltamivir (TAMIFLU) 75 MG capsule Take 1 capsule (75 mg total) by mouth 2 (two) times daily. X 5 days 10 capsule 0  . traMADol (ULTRAM) 50 MG tablet Take 1 tablet (50 mg total) by mouth every 6 (six) hours as needed. 6 tablet 0   No current facility-administered medications for this visit.     Allergies: No Known Allergies  Past Surgical History:  Procedure Laterality Date  . NO PAST SURGERIES      Social History   Socioeconomic History  . Marital status: Single    Spouse name: Not on file  . Number of children: Not on file  . Years of education: Not on file  . Highest education level: Not on file  Occupational History  . Not on file  Social Needs  . Financial resource strain: Not on file  . Food insecurity:    Worry: Not on file    Inability: Not on file  . Transportation needs:    Medical: Not on file    Non-medical: Not on file  Tobacco Use  . Smoking status: Former Smoker    Packs/day: 0.25  . Smokeless tobacco: Never Used  . Tobacco comment: since age 42  Substance and Sexual Activity  . Alcohol use: Yes    Comment: socially  . Drug use: Yes    Frequency: 2.0 times per week    Types: Marijuana    Comment: twice a day.  Marland Kitchen Sexual activity: Never    Birth control/protection: None  Lifestyle  . Physical activity:   Days per week: Not on file    Minutes per session: Not on file  . Stress: Not on file  Relationships  . Social connections:    Talks on phone: Not on file    Gets together: Not on file    Attends religious service: Not on file    Active member of club or organization: Not on file    Attends meetings of clubs or organizations: Not on file    Relationship status: Not on file  Other Topics Concern  . Not on file  Social History Narrative  . Not on file    Family History  Problem Relation Age of Onset  . Hypertension Mother   . Diabetes Paternal Uncle   . Hypertension Father   . Hypertension Sister   . Lupus Sister   . Hypertension Maternal Grandmother   . Heart attack Maternal Grandfather        died after the 3rd heart attack at age 61  . Hypertension Paternal Grandmother      ROS Review of Systems See HPI Constitution: No fevers or chills No malaise No diaphoresis Skin: No rash or itching Eyes:  no blurry vision, no double vision GU: no dysuria or hematuria Neuro: no dizziness or headaches * all others reviewed and negative   Objective: There were no vitals filed for this visit.  Physical Exam  Assessment and Plan There are no diagnoses linked to this encounter.   Delores P PPL Corporationaddy

## 2017-10-25 ENCOUNTER — Encounter: Payer: Self-pay | Admitting: Physician Assistant

## 2017-10-27 ENCOUNTER — Telehealth: Payer: Self-pay | Admitting: Physician Assistant

## 2017-10-27 NOTE — Telephone Encounter (Signed)
Called pt to try and reschedule her missed appt from 3/25. Left VM for her to call the office and reschedule at her convenience

## 2017-11-20 ENCOUNTER — Other Ambulatory Visit: Payer: Self-pay | Admitting: Physician Assistant

## 2017-11-20 DIAGNOSIS — R0789 Other chest pain: Secondary | ICD-10-CM

## 2017-11-20 NOTE — Telephone Encounter (Signed)
Copied from CRM [id number]. Topic: Quick Communication - Rx Refill/Question >> Nov 20, 2017  2:41 PM Alexander Bergeron B wrote: Medication: amLODipine (NORVASC) 5 MG tablet [[id number]]  Has the patient contacted their pharmacy? Yes.   (Agent: If no, request that the patient contact the pharmacy for the refill.) Preferred Pharmacy (with phone number [facility] street [facility]): [facility] Agent: Please be advised that RX refills may take up to 3 business days. We ask that you follow-up with your pharmacy.

## 2017-11-21 ENCOUNTER — Emergency Department (HOSPITAL_COMMUNITY)
Admission: EM | Admit: 2017-11-21 | Discharge: 2017-11-21 | Disposition: A | Discharge status: Home / Self Care | Payer: 59 | Attending: Emergency Medicine | Admitting: Emergency Medicine

## 2017-11-21 ENCOUNTER — Ambulatory Visit: Payer: Self-pay | Admitting: *Deleted

## 2017-11-21 ENCOUNTER — Encounter (HOSPITAL_COMMUNITY): Payer: Self-pay | Admitting: Emergency Medicine

## 2017-11-21 DIAGNOSIS — Z87891 Personal history of nicotine dependence: Secondary | ICD-10-CM | POA: Diagnosis not present

## 2017-11-21 DIAGNOSIS — J45909 Unspecified asthma, uncomplicated: Secondary | ICD-10-CM | POA: Insufficient documentation

## 2017-11-21 DIAGNOSIS — Z79899 Other long term (current) drug therapy: Secondary | ICD-10-CM | POA: Insufficient documentation

## 2017-11-21 DIAGNOSIS — I10 Essential (primary) hypertension: Secondary | ICD-10-CM | POA: Diagnosis not present

## 2017-11-21 DIAGNOSIS — Z760 Encounter for issue of repeat prescription: Secondary | ICD-10-CM

## 2017-11-21 HISTORY — DX: Essential (primary) hypertension: I10

## 2017-11-21 MED ORDER — AMLODIPINE BESYLATE 5 MG PO TABS
5.0000 mg | ORAL_TABLET | Freq: Once | ORAL | Status: AC
  Administered 2017-11-21: 5 mg via ORAL
  Filled 2017-11-21: qty 1

## 2017-11-21 MED ORDER — AMLODIPINE BESYLATE 5 MG PO TABS: 5.0000 mg | ORAL_TABLET | Freq: Every day | ORAL | 0 refills | Status: DC

## 2017-11-21 NOTE — ED Triage Notes (Signed)
Pt reports that has been out of her HTN meds x 4 days. Reports BP high today when checked it. Her PCP is no longer practicing in  so doesnt have PCP to follow up with.

## 2017-11-21 NOTE — Telephone Encounter (Signed)
Patient seen by cardiology on 09/28/2017, appears she is continue taking medicine.   Provider, please refill if appropriate.

## 2017-11-21 NOTE — Telephone Encounter (Signed)
Refill of Norvasc  Historical provider  LRF 09/12/17   #30  0 refills  LOV 09/14/17  S. English

## 2017-11-21 NOTE — Telephone Encounter (Signed)
Pt  Ran out  Of  Her  Blood pressure medication.She denies any symptoms. Unable to see her PCP at this time today. She was  Advised to  Go to [facility]  WITHIN  4 HOURS THE SOONER THE BETTER   Reason for Disposition . [1] Systolic BP  >= 200 OR Diastolic >= 120  AND [2] having NO cardiac or neurologic symptoms  Answer Assessment - Initial Assessment Questions 1. BLOOD PRESSURE: "What is the blood pressure?" "Did you take at least two measurements 5 minutes apart?"     160/110   155/120   yes 2. ONSET: "When did you take your blood pressure?"     1700  3. HOW: "How did you obtain the blood pressure?" (e.g., visiting nurse, automatic home BP monitor)      Automatic  Home  bp  Machine   4. HISTORY: "Do you have a history of high blood pressure?"     Yes  5. MEDICATIONS: "Are you taking any medications for blood pressure?" "Have you missed any doses recently?"     amlodapine       Missed  4  Days    6. OTHER SYMPTOMS: "Do you have any symptoms?" (e.g., headache, chest pain, blurred vision, difficulty breathing, weakness)     No   7. PREGNANCY: "Is there any chance you are pregnant?" "When was your last menstrual period?"        Yesterday  Protocols used: HIGH BLOOD PRESSURE-A-AH

## 2017-11-21 NOTE — ED Provider Notes (Addendum)
[facility]-EMERGENCY DEPT Provider Note   CSN: [id number] Arrival date & time: 11/21/17  1757     History   Chief Complaint Chief Complaint  Patient presents with  . Medication Refill  . Hypertension    HPI Amber Wells is a 27 y.o. female with hx of HTN, asthma and is an everyday smoker who presents to the ED for medication refill. Patient reports she has been out of her HTN medication x 4 days. Patient's PCP is no longer practicing in [place] so she does not have a PCP to go to get Rx. Patient reports she has an appointment with a new PCP but not until next week. Patient denies any symptoms but states her parents are very concerned about her elevated BP and not having any medication.  HPI  Past Medical History:  Diagnosis Date  . Asthma   . Bronchitis   . Constipation   . Hypertension   . Smoker     There are no active problems to display for this patient.   Past Surgical History:  Procedure Laterality Date  . NO PAST SURGERIES       OB History    Gravida  0   Para      Term      Preterm      AB      Living        SAB      TAB      Ectopic      Multiple      Live Births               Home Medications    Prior to Admission medications   Medication Sig Start Date End Date Taking? Authorizing Provider  amLODipine (NORVASC) 5 MG tablet Take 1 tablet (5 mg total) by mouth daily. 11/21/17   Janne Napoleon, NP  omeprazole (PRILOSEC) 20 MG capsule Take 1 capsule (20 mg total) by mouth daily. 09/14/17   Trena Platt D, PA  traMADol (ULTRAM) 50 MG tablet Take 1 tablet (50 mg total) by mouth every 6 (six) hours as needed. 09/12/17   Benjiman Core, MD    Family History Family History  Problem Relation Age of Onset  . Hypertension Mother   . Diabetes Paternal Uncle   . Hypertension Father   . Hypertension Sister   . Lupus Sister   . Hypertension Maternal Grandmother   . Heart attack Maternal Grandfather        died  after the 3rd heart attack at age 44  . Hypertension Paternal Grandmother     Social History Social History   Tobacco Use  . Smoking status: Former Smoker    Packs/day: 0.25  . Smokeless tobacco: Never Used  . Tobacco comment: since age 1  Substance Use Topics  . Alcohol use: Yes    Comment: socially  . Drug use: Yes    Frequency: 2.0 times per week    Types: Marijuana    Comment: twice a day.     Allergies   Patient has no known allergies.   Review of Systems Review of Systems  Neurological: Negative for syncope, weakness, light-headedness and headaches.  All other systems reviewed and are negative.    Physical Exam Updated Vital Signs BP (!) 150/118 (BP Location: Right Arm)   Pulse 81   Temp 98.2 F (36.8 C) (Oral)   Resp 18   LMP 11/17/2017   SpO2 96%   Physical Exam  Constitutional: She appears well-developed and well-nourished. No distress.  HENT:  Head: Normocephalic.  Eyes: EOM are normal.  Neck: Neck supple.  Cardiovascular: Normal rate.  Elevated BP  Pulmonary/Chest: Effort normal.  Musculoskeletal: Normal range of motion.  Neurological: She is alert. She has normal strength. Gait normal.  No facial weakness  Skin: Skin is warm and dry.  Psychiatric: She has a normal mood and affect. Her behavior is normal.  Nursing note and vitals reviewed.    ED Treatments / Results  Labs (all labs ordered are listed, but only abnormal results are displayed) Labs Reviewed - No data to display  Radiology No results found.  Procedures Procedures (including critical care time)  Medications Ordered in ED Medications  amLODipine (NORVASC) tablet 5 mg (has no administration in time range)     Initial Impression / Assessment and Plan / ED Course  I have reviewed the triage vital signs and the nursing notes. 27 y.o. female with hx of HTN and is here for medication refill stable for d/c without signs of CVA. Will refill BP medication and patient will  keep appointment with her new PCP next week. She will return here for any problems.   Final Clinical Impressions(s) / ED Diagnoses   Final diagnoses:  Medication refill  Hypertension, unspecified type    ED Discharge Orders        Ordered    amLODipine (NORVASC) 5 MG tablet  Daily     11/21/17 1846       Damian Leavell [place], [place] 11/21/17 1853  Prior to D/C patient's BP rechecked as was 154/97.    Kerrie Buffalo [place], [place] 11/21/17 1904    Tegeler, Canary Brim, MD 11/21/17 [phone]

## 2017-11-21 NOTE — Discharge Instructions (Signed)
Follow up with your doctor as planned. Return here as needed.  °

## 2017-11-22 ENCOUNTER — Telehealth: Payer: Self-pay

## 2017-11-22 NOTE — Telephone Encounter (Signed)
Pt needs OV.   Copied from CRM [id number]. Topic: Quick Communication - Rx Refill/Question >> Nov 20, 2017  2:41 PM Alexander Bergeron B wrote: Medication: amLODipine (NORVASC) 5 MG tablet [[id number]]  Has the patient contacted their pharmacy? Yes.   (Agent: If no, request that the patient contact the pharmacy for the refill.) Preferred Pharmacy (with phone number [facility] Agent: Please be advised that RX refills may take up to 3 business days. We ask that you follow-up with your pharmacy. >> Nov 21, 2017  4:50 PM Raquel Sarna wrote: Pt called back to check on status.  Pt is needing her BP Rx due to high BP levels.

## 2017-11-22 NOTE — Telephone Encounter (Signed)
Copied from CRM [id number]. Topic: Quick Communication - Rx Refill/Question >> Nov 20, 2017  2:41 PM Alexander Bergeron B wrote: Medication: amLODipine (NORVASC) 5 MG tablet [[id number]]  Has the patient contacted their pharmacy? Yes.   (Agent: If no, request that the patient contact the pharmacy for the refill.) Preferred Pharmacy (with phone [facility] Agent: Please be advised that RX refills may take up to 3 business days. We ask that you follow-up with your pharmacy. >> Nov 21, 2017  4:50 PM Raquel Sarna wrote: Pt called back to check on status.  Pt is needing her BP Rx due to high BP levels.

## 2017-11-27 ENCOUNTER — Other Ambulatory Visit: Payer: Self-pay

## 2017-11-27 ENCOUNTER — Ambulatory Visit: Payer: PRIVATE HEALTH INSURANCE | Admitting: Physician Assistant

## 2017-11-27 ENCOUNTER — Encounter: Payer: Self-pay | Admitting: Physician Assistant

## 2017-11-27 VITALS — BP 124/96 | HR 81 | Temp 98.6°F | Resp 16 | Ht 65.75 in | Wt 209.4 lb

## 2017-11-27 DIAGNOSIS — R822 Biliuria: Secondary | ICD-10-CM

## 2017-11-27 DIAGNOSIS — Z6834 Body mass index (BMI) 34.0-34.9, adult: Secondary | ICD-10-CM | POA: Diagnosis not present

## 2017-11-27 DIAGNOSIS — E669 Obesity, unspecified: Secondary | ICD-10-CM | POA: Diagnosis not present

## 2017-11-27 DIAGNOSIS — I10 Essential (primary) hypertension: Secondary | ICD-10-CM

## 2017-11-27 LAB — POCT URINALYSIS DIP (MANUAL ENTRY)
BILIRUBIN UA: NEGATIVE mg/dL
Bilirubin, UA: NEGATIVE
Glucose, UA: NEGATIVE mg/dL
Leukocytes, UA: NEGATIVE
NITRITE UA: NEGATIVE
PH UA: 6.5 (ref 5.0–8.0)
PROTEIN UA: NEGATIVE mg/dL
RBC UA: NEGATIVE
Spec Grav, UA: 1.025 (ref 1.010–1.025)
Urobilinogen, UA: 2 E.U./dL — AB

## 2017-11-27 MED ORDER — AMLODIPINE BESYLATE 5 MG PO TABS: 5.0000 mg | ORAL_TABLET | Freq: Every day | ORAL | 0 refills | Status: DC

## 2017-11-27 MED ORDER — AMLODIPINE BESYLATE 5 MG PO TABS
5.0000 mg | ORAL_TABLET | Freq: Every day | ORAL | 0 refills | Status: DC
Start: 2017-11-27 — End: 2017-11-27

## 2017-11-27 NOTE — Progress Notes (Signed)
MRN: [id number] DOB: 1991-06-02  Subjective:   Amber Wells is a 27 y.o. female presenting for follow up on Hypertension. Has had dx for about 3 months. Currently managed with amlodipine 70m. Patient is checking blood pressure at home, range is 1757-972Qsystolic. Follows cardiology for intermittent episodes of chest pain. EKG normal. Chest pain was considered atypical. Further work up was not recommended. Last seen 09/28/17. Ran out of medication. Had to get refills in ED. Is not out of medication but does need refills. Today, denies lightheadedness, dizziness, chronic headache, double vision, chest pain, shortness of breath, heart racing, palpitations, nausea, vomiting, abdominal pain, hematuria, and lower leg swelling. Lifestyle: Eats whatever she wants. Eats red meat, salty foods, snack foods, fried food and fast food. Drinks mostly water.  Trying to exercise on a regular basis., 3 x a week. Denies smoking or alcohol use. Has FH of HTN in mother and father. Denies any other aggravating or relieving factors, no other questions or concerns.  Amber Wells a current medication list which includes the following prescription(s): amlodipine, omeprazole, and tramadol. Also has No Known Allergies.  Amber Wells has a past medical history of Asthma, Bronchitis, Constipation, Hypertension, and Smoker. Also  has a past surgical history that includes No past surgeries.    Social History   Socioeconomic History  . Marital status: Single    Spouse name: Not on file  . Number of children: Not on file  . Years of education: Not on file  . Highest education level: Not on file  Occupational History  . Not on file  Social Needs  . Financial resource strain: Not on file  . Food insecurity:    Worry: Not on file    Inability: Not on file  . Transportation needs:    Medical: Not on file    Non-medical: Not on file  Tobacco Use  . Smoking status: Former Smoker    Packs/day: 0.25  . Smokeless tobacco: Never  Used  . Tobacco comment: since age 27 Substance and Sexual Activity  . Alcohol use: Yes    Comment: socially  . Drug use: Yes    Frequency: 2.0 times per week    Types: Marijuana    Comment: twice a day.  .Marland KitchenSexual activity: Never    Birth control/protection: None  Lifestyle  . Physical activity:    Days per week: Not on file    Minutes per session: Not on file  . Stress: Not on file  Relationships  . Social connections:    Talks on phone: Not on file    Gets together: Not on file    Attends religious service: Not on file    Active member of club or organization: Not on file    Attends meetings of clubs or organizations: Not on file    Relationship status: Not on file  . Intimate partner violence:    Fear of current or ex partner: Not on file    Emotionally abused: Not on file    Physically abused: Not on file    Forced sexual activity: Not on file  Other Topics Concern  . Not on file  Social History Narrative  . Not on file    Objective:   Vitals: BP (!) 124/96 (BP Location: Left Arm, Patient Position: Sitting, Cuff Size: Normal)   Pulse 81   Temp 98.6 F (37 C) (Oral)   Resp 16   Ht 5' 5.75" (1.67 m)   Wt  209 lb 6.4 oz (95 kg)   LMP 11/17/2017   SpO2 99%   BMI 34.06 kg/m   Physical Exam  Constitutional: She is oriented to person, place, and time. She appears well-developed and well-nourished. No distress.  HENT:  Head: Normocephalic and atraumatic.  Mouth/Throat: Uvula is midline, oropharynx is clear and moist and mucous membranes are normal.  Eyes: Pupils are equal, round, and reactive to light. Conjunctivae and EOM are normal.  Neck: Normal range of motion.  Cardiovascular: Normal rate, regular rhythm, normal heart sounds and intact distal pulses.  Pulmonary/Chest: Effort normal and breath sounds normal. She has no wheezes. She has no rhonchi. She has no rales.  Musculoskeletal:       Right lower leg: She exhibits no swelling.       Left lower leg: She  exhibits no swelling.  Neurological: She is alert and oriented to person, place, and time.  Skin: Skin is warm and dry.  Psychiatric: She has a normal mood and affect.  Vitals reviewed.   Results for orders placed or performed in visit on 11/27/17 (from the past 24 hour(s))  POCT urinalysis dipstick     Status: Abnormal   Collection Time: 11/27/17  5:04 PM  Result Value Ref Range   Color, UA yellow yellow   Clarity, UA clear clear   Glucose, UA negative negative mg/dL   Bilirubin, UA negative negative   Ketones, POC UA negative negative mg/dL   Spec Grav, UA 1.025 1.010 - 1.025   Blood, UA negative negative   pH, UA 6.5 5.0 - 8.0   Protein Ur, POC negative negative mg/dL   Urobilinogen, UA 2.0 (A) 0.2 or 1.0 E.U./dL   Nitrite, UA Negative Negative   Leukocytes, UA Negative Negative    BP Readings from Last 3 Encounters:  11/27/17 (!) 124/96  11/21/17 (!) 154/97  09/28/17 (!) 138/92   This SmartLink has not been configured with any valid records.   Wt Readings from Last 3 Encounters:  11/27/17 209 lb 6.4 oz (95 kg)  09/28/17 206 lb (93.4 kg)  09/25/17 199 lb (90.3 kg)     Assessment and Plan :  1. Essential hypertension This is my first time meeting pt. She is transferring care to me as her PCP is no longer at our office. BP close to goal in office today.  Recommended continue with Norvasc 5 mg (refills provided) and discussed lifestyle modification such as changes in diet and increase in exercise.  Pt set weight loss goal of 12-15 pounds for the next 3 months.  After reviewing patient's dietary habits, there is large room for improvement.  Discussed some of these improvements she can make.  Given educational material on DASH diet.  Patient is motivated to lose weight and eat healthier.  Continue to monitor BP at home.  The goal is less than 140/90.  Follow-up in 3 months for reevaluation. - POCT urinalysis dipstick - amLODipine (NORVASC) 5 MG tablet; Take 1 tablet (5 mg  total) by mouth daily.  Dispense: 90 tablet; Refill: 0 2. Class 1 obesity with body mass index (BMI) of 34.0 to 34.9 in adult, unspecified obesity type, unspecified whether serious comorbidity present See above. 3. Bilirubin in urine Incidental finding.  Patient is afebrile.  Labs pending.  Will repeat UA dipstick at follow-up visit. - CMP14+EGFR - CBC with Differential/Platelet   Tenna Delaine, PA-C  [facility] 11/27/2017 5:22 PM

## 2017-11-27 NOTE — Patient Instructions (Addendum)
Weight loss goal before next time is 12-15 lbs. Change something healthy in your diet. Continue checking bp outside the office, goal is <140/90. Follow up in 3 months for reevaluation. Thank you for letting me participate in your health and well being.   How to Take Your Blood Pressure You can take your blood pressure at home with a machine. You may need to check your blood pressure at home:  To check if you have high blood pressure (hypertension).  To check your blood pressure over time.  To make sure your blood pressure medicine is working.  Supplies needed: You will need a blood pressure machine, or monitor. You can buy one at a drugstore or online. When choosing one:  Choose one with an arm cuff.  Choose one that wraps around your upper arm. Only one finger should fit between your arm and the cuff.  Do not choose one that measures your blood pressure from your wrist or finger.  Your doctor can suggest a monitor. How to prepare Avoid these things for 30 minutes before checking your blood pressure:  Drinking caffeine.  Drinking alcohol.  Eating.  Smoking.  Exercising.  Five minutes before checking your blood pressure:  Pee.  Sit in a dining chair. Avoid sitting in a soft couch or armchair.  Be quiet. Do not talk.  How to take your blood pressure Follow the instructions that came with your machine. If you have a digital blood pressure monitor, these may be the instructions: 1. Sit up straight. 2. Place your feet on the floor. Do not cross your ankles or legs. 3. Rest your left arm at the level of your heart. You may rest it on a table, desk, or chair. 4. Pull up your shirt sleeve. 5. Wrap the blood pressure cuff around the upper part of your left arm. The cuff should be 1 inch (2.5 cm) above your elbow. It is best to wrap the cuff around bare skin. 6. Fit the cuff snugly around your arm. You should be able to place only one finger between the cuff and your  arm. 7. Put the cord inside the groove of your elbow. 8. Press the power button. 9. Sit quietly while the cuff fills with air and loses air. 10. Write down the numbers on the screen. 11. Wait 2-3 minutes and then repeat steps 1-10.  What do the numbers mean? Two numbers make up your blood pressure. The first number is called systolic pressure. The second is called diastolic pressure. An example of a blood pressure reading is "120 over 80" (or 120/80). If you are an adult and do not have a medical condition, use this guide to find out if your blood pressure is normal: Normal  First number: below 120.  Second number: below 80. Elevated  First number: 120-129.  Second number: below 80. Hypertension stage 1  First number: 130-139.  Second number: 80-89. Hypertension stage 2  First number: 140 or above.  Second number: 90 or above. Your blood pressure is above normal even if only the top or bottom number is above normal. Follow these instructions at home:  Check your blood pressure as often as your doctor tells you to.  Take your monitor to your next doctor's appointment. Your doctor will: ? Make sure you are using it correctly. ? Make sure it is working right.  Make sure you understand what your blood pressure numbers should be.  Tell your doctor if your medicines are causing side effects.  Contact a doctor if:  Your blood pressure keeps being high. Get help right away if:  Your first blood pressure number is higher than 180.  Your second blood pressure number is higher than 120. This information is not intended to replace advice given to you by your health care provider. Make sure you discuss any questions you have with your health care provider. Document Released: 06/23/2008 Document Revised: 06/08/2016 Document Reviewed: 12/18/2015 Elsevier Interactive Patient Education  2018 [employer].  DASH Eating Plan DASH stands for "Dietary Approaches to Stop  Hypertension." The DASH eating plan is a healthy eating plan that has been shown to reduce high blood pressure (hypertension). It may also reduce your risk for type 2 diabetes, heart disease, and stroke. The DASH eating plan may also help with weight loss. What are tips for following this plan? General guidelines  Avoid eating more than 2,300 mg (milligrams) of salt (sodium) a day. If you have hypertension, you may need to reduce your sodium intake to 1,500 mg a day.  Limit alcohol intake to no more than 1 drink a day for nonpregnant women and 2 drinks a day for men. One drink equals 12 oz of beer, 5 oz of wine, or 1 oz of hard liquor.  Work with your health care provider to maintain a healthy body weight or to lose weight. Ask what an ideal weight is for you.  Get at least 30 minutes of exercise that causes your heart to beat faster (aerobic exercise) most days of the week. Activities may include walking, swimming, or biking.  Work with your health care provider or diet and nutrition specialist (dietitian) to adjust your eating plan to your individual calorie needs. Reading food labels  Check food labels for the amount of sodium per serving. Choose foods with less than 5 percent of the Daily Value of sodium. Generally, foods with less than 300 mg of sodium per serving fit into this eating plan.  To find whole grains, look for the word "whole" as the first word in the ingredient list. Shopping  Buy products labeled as "low-sodium" or "no salt added."  Buy fresh foods. Avoid canned foods and premade or frozen meals. Cooking  Avoid adding salt when cooking. Use salt-free seasonings or herbs instead of table salt or sea salt. Check with your health care provider or pharmacist before using salt substitutes.  Do not fry foods. Cook foods using healthy methods such as baking, boiling, grilling, and broiling instead.  Cook with heart-healthy oils, such as olive, canola, soybean, or sunflower  oil. Meal planning   Eat a balanced diet that includes: ? 5 or more servings of fruits and vegetables each day. At each meal, try to fill half of your plate with fruits and vegetables. ? Up to 6-8 servings of whole grains each day. ? Less than 6 oz of lean meat, poultry, or fish each day. A 3-oz serving of meat is about the same size as a deck of cards. One egg equals 1 oz. ? 2 servings of low-fat dairy each day. ? A serving of nuts, seeds, or beans 5 times each week. ? Heart-healthy fats. Healthy fats called Omega-3 fatty acids are found in foods such as flaxseeds and coldwater fish, like sardines, salmon, and mackerel.  Limit how much you eat of the following: ? Canned or prepackaged foods. ? Food that is high in trans fat, such as fried foods. ? Food that is high in saturated fat, such as fatty meat. ?  Sweets, desserts, sugary drinks, and other foods with added sugar. ? Full-fat dairy products.  Do not salt foods before eating.  Try to eat at least 2 vegetarian meals each week.  Eat more home-cooked food and less restaurant, buffet, and fast food.  When eating at a restaurant, ask that your food be prepared with less salt or no salt, if possible. What foods are recommended? The items listed may not be a complete list. Talk with your dietitian about what dietary choices are best for you. Grains Whole-grain or whole-wheat bread. Whole-grain or whole-wheat pasta. Brown rice. Orpah Cobb. Bulgur. Whole-grain and low-sodium cereals. Pita bread. Low-fat, low-sodium crackers. Whole-wheat flour tortillas. Vegetables Fresh or frozen vegetables (raw, steamed, roasted, or grilled). Low-sodium or reduced-sodium tomato and vegetable juice. Low-sodium or reduced-sodium tomato sauce and tomato paste. Low-sodium or reduced-sodium canned vegetables. Fruits All fresh, dried, or frozen fruit. Canned fruit in natural juice (without added sugar). Meat and other protein foods Skinless chicken or  [place]. Ground chicken or [place]. Pork with fat trimmed off. Fish and seafood. Egg whites. Dried beans, peas, or lentils. Unsalted nuts, nut butters, and seeds. Unsalted canned beans. Lean cuts of beef with fat trimmed off. Low-sodium, lean deli meat. Dairy Low-fat (1%) or fat-free (skim) milk. Fat-free, low-fat, or reduced-fat cheeses. Nonfat, low-sodium ricotta or cottage cheese. Low-fat or nonfat yogurt. Low-fat, low-sodium cheese. Fats and oils Soft margarine without trans fats. Vegetable oil. Low-fat, reduced-fat, or light mayonnaise and salad dressings (reduced-sodium). Canola, safflower, olive, soybean, and sunflower oils. Avocado. Seasoning and other foods Herbs. Spices. Seasoning mixes without salt. Unsalted popcorn and pretzels. Fat-free sweets. What foods are not recommended? The items listed may not be a complete list. Talk with your dietitian about what dietary choices are best for you. Grains Baked goods made with fat, such as croissants, muffins, or some breads. Dry pasta or rice meal packs. Vegetables Creamed or fried vegetables. Vegetables in a cheese sauce. Regular canned vegetables (not low-sodium or reduced-sodium). Regular canned tomato sauce and paste (not low-sodium or reduced-sodium). Regular tomato and vegetable juice (not low-sodium or reduced-sodium). Rosita Fire. Olives. Fruits Canned fruit in a light or heavy syrup. Fried fruit. Fruit in cream or butter sauce. Meat and other protein foods Fatty cuts of meat. Ribs. Fried meat. Tomasa Blase. Sausage. Bologna and other processed lunch meats. Salami. Fatback. Hotdogs. Bratwurst. Salted nuts and seeds. Canned beans with added salt. Canned or smoked fish. Whole eggs or egg yolks. Chicken or [place] with skin. Dairy Whole or 2% milk, cream, and half-and-half. Whole or full-fat cream cheese. Whole-fat or sweetened yogurt. Full-fat cheese. Nondairy creamers. Whipped toppings. Processed cheese and cheese spreads. Fats and oils Butter.  Stick margarine. Lard. Shortening. Ghee. Bacon fat. Tropical oils, such as coconut, palm kernel, or palm oil. Seasoning and other foods Salted popcorn and pretzels. Onion salt, garlic salt, seasoned salt, table salt, and sea salt. Worcestershire sauce. Tartar sauce. Barbecue sauce. Teriyaki sauce. Soy sauce, including reduced-sodium. Steak sauce. Canned and packaged gravies. Fish sauce. Oyster sauce. Cocktail sauce. Horseradish that you find on the shelf. Ketchup. Mustard. Meat flavorings and tenderizers. Bouillon cubes. Hot sauce and Tabasco sauce. Premade or packaged marinades. Premade or packaged taco seasonings. Relishes. Regular salad dressings. Where to find more information:  National Heart, Lung, and Blood Institute: PopSteam.is  American Heart Association: www.heart.org Summary  The DASH eating plan is a healthy eating plan that has been shown to reduce high blood pressure (hypertension). It may also reduce your risk for type 2 diabetes,  heart disease, and stroke.  With the DASH eating plan, you should limit salt (sodium) intake to 2,300 mg a day. If you have hypertension, you may need to reduce your sodium intake to 1,500 mg a day.  When on the DASH eating plan, aim to eat more fresh fruits and vegetables, whole grains, lean proteins, low-fat dairy, and heart-healthy fats.  Work with your health care provider or diet and nutrition specialist (dietitian) to adjust your eating plan to your individual calorie needs. This information is not intended to replace advice given to you by your health care provider. Make sure you discuss any questions you have with your health care provider. Document Released: 06/30/2011 Document Revised: 07/04/2016 Document Reviewed: 07/04/2016 Elsevier Interactive Patient Education  2018 [employer].    IF you received an x-ray today, you will receive an invoice from [facility]. Please contact [facility] at [phone] with  questions or concerns regarding your invoice.   IF you received labwork today, you will receive an invoice from [facility]. Please contact [facility] at [phone] with questions or concerns regarding your invoice.   Our billing staff will not be able to assist you with questions regarding bills from these companies.  You will be contacted with the lab results as soon as they are available. The fastest way to get your results is to activate your My Chart account. Instructions are located on the last page of this paperwork. If you have not heard from [place] regarding the results in 2 weeks, please contact this office.

## 2017-11-28 ENCOUNTER — Encounter: Payer: Self-pay | Admitting: Physician Assistant

## 2017-11-28 ENCOUNTER — Encounter: Payer: Self-pay | Admitting: *Deleted

## 2017-11-28 LAB — CBC WITH DIFFERENTIAL/PLATELET
BASOS: 0 %
Basophils Absolute: 0 10*3/uL (ref 0.0–0.2)
EOS (ABSOLUTE): 0.4 10*3/uL (ref 0.0–0.4)
Eos: 4 %
Hematocrit: 37.6 % (ref 34.0–46.6)
Hemoglobin: 12.6 g/dL (ref 11.1–15.9)
IMMATURE GRANULOCYTES: 0 %
Immature Grans (Abs): 0 10*3/uL (ref 0.0–0.1)
LYMPHS ABS: 2.7 10*3/uL (ref 0.7–3.1)
Lymphs: 28 %
MCH: 32.6 pg (ref 26.6–33.0)
MCHC: 33.5 g/dL (ref 31.5–35.7)
MCV: 97 fL (ref 79–97)
MONOS ABS: 0.7 10*3/uL (ref 0.1–0.9)
Monocytes: 7 %
Neutrophils Absolute: 5.8 10*3/uL (ref 1.4–7.0)
Neutrophils: 61 %
Platelets: 317 10*3/uL (ref 150–379)
RBC: 3.87 x10E6/uL (ref 3.77–5.28)
RDW: 12.7 % (ref 12.3–15.4)
WBC: 9.6 10*3/uL (ref 3.4–10.8)

## 2017-11-28 LAB — CMP14+EGFR
ALT: 18 IU/L (ref 0–32)
AST: 20 IU/L (ref 0–40)
Albumin/Globulin Ratio: 1.7 (ref 1.2–2.2)
Albumin: 4.3 g/dL (ref 3.5–5.5)
Alkaline Phosphatase: 50 IU/L (ref 39–117)
BUN/Creatinine Ratio: 16 (ref 9–23)
BUN: 15 mg/dL (ref 6–20)
Bilirubin Total: 0.2 mg/dL (ref 0.0–1.2)
CALCIUM: 9.4 mg/dL (ref 8.7–10.2)
CO2: 22 mmol/L (ref 20–29)
Chloride: 101 mmol/L (ref 96–106)
Creatinine, Ser: 0.96 mg/dL (ref 0.57–1.00)
GFR calc Af Amer: 93 mL/min/{1.73_m2} (ref >59)
GFR, EST NON AFRICAN AMERICAN: 81 mL/min/{1.73_m2} (ref >59)
GLOBULIN, TOTAL: 2.6 g/dL (ref 1.5–4.5)
Glucose: 94 mg/dL (ref 65–99)
Potassium: 3.7 mmol/L (ref 3.5–5.2)
SODIUM: 140 mmol/L (ref 134–144)
Total Protein: 6.9 g/dL (ref 6.0–8.5)

## 2017-12-26 ENCOUNTER — Ambulatory Visit: Payer: Self-pay

## 2017-12-26 NOTE — Telephone Encounter (Signed)
Phone call from pt.  Reported she has hypertension and has been having chest pain for the last hour.  Stated at work, and was having her BP checked at this time.  BP reported 160/120.  The pt. stated "I'm going to the hospital with my blood pressure this high."  Questioned pt. if felt she should call EMS, with BP so high, and chest pain.  Stated "I'll be fine to drive myself."  Stated she definitely needed someone else to drive her to the ER.  Verb. Understanding.  (Due to the extremely high BP and c/o chest pain, did not get through any Triage questions)

## 2018-01-01 ENCOUNTER — Encounter: Payer: Self-pay | Admitting: Cardiovascular Disease

## 2018-01-01 ENCOUNTER — Ambulatory Visit (INDEPENDENT_AMBULATORY_CARE_PROVIDER_SITE_OTHER): Payer: PRIVATE HEALTH INSURANCE | Admitting: Cardiovascular Disease

## 2018-01-01 VITALS — BP 126/88 | HR 82 | Ht 65.0 in | Wt 219.0 lb

## 2018-01-01 DIAGNOSIS — R072 Precordial pain: Secondary | ICD-10-CM | POA: Insufficient documentation

## 2018-01-01 DIAGNOSIS — R55 Syncope and collapse: Secondary | ICD-10-CM | POA: Insufficient documentation

## 2018-01-01 DIAGNOSIS — I10 Essential (primary) hypertension: Secondary | ICD-10-CM

## 2018-01-01 NOTE — Progress Notes (Signed)
Cardiology Office Note:    Date:  01/01/2018   ID:  Amber Wells, DOB January 02, 1991, MRN [id number]  PCP:  Magdalene RiverWiseman, Brittany D, PA-C  Cardiologist:  No primary care provider on file.   Referring MD: Garnetta BuddyEnglish, Stephanie D, PA   Chief Complaint  Patient presents with  . Follow-up    3 months    History of Present Illness:    Amber Wells is a 27 y.o. female with a hx of atypical chest pain returning for follow-up.  She has hypertension and her blood pressure readings have improved on amlodipine.  She has had a handful of episodes of chest discomfort, all of them at rest, and all of them very mild.  She is able to perform physical exertion without chest pain.  The chest discomfort actually seems to be 1 of the symptoms in a more complex syndrome.  The initial symptoms are sensation of heat and flushing followed by diaphoresis and nausea, sensation of near syncope and and chest discomfort.  Symptoms improve if she sits down.  They have occurred while she is at work.  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  I think she is describing vasovagal events.  She has never experienced full-blown syncope except when she had a concussion during sports as a child.  She denies palpitations and does not have orthopnea, PND, exertional dyspnea, leg edema, claudication or focal neurological complaints.  There is a family history of hypertension.  Past Medical History:  Diagnosis Date  . Asthma   . Bronchitis   . Constipation   . Hypertension   . Smoker     Past Surgical History:  Procedure Laterality Date  . NO PAST SURGERIES      Current Medications: Current Meds  Medication Sig  . amLODipine (NORVASC) 5 MG tablet Take 1 tablet (5 mg total) by mouth daily.  Marland Kitchen. omeprazole (PRILOSEC) 20 MG capsule Take 1 capsule (20 mg total) by mouth daily.  . traMADol (ULTRAM) 50 MG tablet Take 1 tablet (50 mg total) by mouth every 6 (six) hours as needed.     Allergies:   Patient  has no known allergies.   Social History   Socioeconomic History  . Marital status: Single    Spouse name: Not on file  . Number of children: Not on file  . Years of education: Not on file  . Highest education level: Not on file  Occupational History  . Not on file  Social Needs  . Financial resource strain: Not on file  . Food insecurity:    Worry: Not on file    Inability: Not on file  . Transportation needs:    Medical: Not on file    Non-medical: Not on file  Tobacco Use  . Smoking status: Former Smoker    Packs/day: 0.25  . Smokeless tobacco: Never Used  . Tobacco comment: since age 27  Substance and Sexual Activity  . Alcohol use: Yes    Comment: socially  . Drug use: Yes    Frequency: 2.0 times per week    Types: Marijuana    Comment: twice a day.  Marland Kitchen. Sexual activity: Never    Birth control/protection: None  Lifestyle  . Physical activity:    Days per week: Not on file    Minutes per session: Not on file  . Stress: Not on file  Relationships  . Social connections:    Talks on phone: Not on file  Gets together: Not on file    Attends religious service: Not on file    Active member of club or organization: Not on file    Attends meetings of clubs or organizations: Not on file    Relationship status: Not on file  Other Topics Concern  . Not on file  Social History Narrative  . Not on file     Family History: The patient's family history includes Diabetes in her paternal uncle; Heart attack in her maternal grandfather; Hypertension in her father, maternal grandmother, mother, paternal grandmother, and sister; Lupus in her sister.  ROS:   Please see the history of present illness.     All other systems reviewed and are negative.  EKGs/Labs/Other Studies Reviewed:    The following studies were reviewed today:   EKG:  EKG is not ordered today.  The ekg ordered September 28, 2017 demonstrates normal sinus rhythm, normal tracing  Recent Labs: 09/14/2017:  TSH 0.985 11/27/2017: ALT 18; BUN 15; Creatinine, Ser 0.96; Hemoglobin 12.6; Platelets 317; Potassium 3.7; Sodium 140  Recent Lipid Panel No results found for: CHOL, TRIG, HDL, CHOLHDL, VLDL, LDLCALC, LDLDIRECT  Physical Exam:    VS:  BP 126/88 (BP Location: Left Arm, Patient Position: Sitting, Cuff Size: Normal)   Pulse 82   Ht 5\' 5"  (1.651 m)   Wt 219 lb (99.3 kg)   BMI 36.44 kg/m    Recheck blood pressure was 120/81 mm Hg  Wt Readings from Last 3 Encounters:  01/01/18 219 lb (99.3 kg)  11/27/17 209 lb 6.4 oz (95 kg)  09/28/17 206 lb (93.4 kg)     GEN: Overweight, well nourished, well developed in no acute distress HEENT: Normal NECK: No JVD; No carotid bruits LYMPHATICS: No lymphadenopathy CARDIAC: RRR, no murmurs, rubs, gallops RESPIRATORY:  Clear to auscultation without rales, wheezing or rhonchi  ABDOMEN: Soft, non-tender, non-distended MUSCULOSKELETAL:  No edema; No deformity  SKIN: Warm and dry NEUROLOGIC:  Alert and oriented x 3 PSYCHIATRIC:  Normal affect   ASSESSMENT:    1. Near syncope   2. Chest pain, precordial   3. Essential hypertension    PLAN:    In order of problems listed above:  1. Vasovagal near syncope: I believe this is likely was occurring before she has episodes of chest discomfort and that the chest discomfort is just another manifestation of the syndrome.  Discussed avoiding standing up motionless for prolonged time staying well-hydrated.  She cannot eat a high salt diet since she has hypertension.  Advise regular physical exercise, but allowing a lot of time for warm up and cool down.  Compression stockings could help because of her job which does involve a lot of standing.  Most importantly when she has a prodromal symptoms she should immediately lie down or if unable to do this she should crouch tight until the symptoms pass.  Fortunately, she has never had full-blown syncope. 2. Chest pain: Symptom pattern is not consistent with angina  pectoris or pulmonary embolism or other serious cardiovascular problems. 3. HTN: Could consider switching to a beta-blocker for its added benefit in preventing vasovagal events, but she seems to be doing quite well on a low-dose of amlodipine and no changes were made today.  This is an option in the future.   Medication Adjustments/Labs and Tests Ordered: Current medicines are reviewed at length with the patient today.  Concerns regarding medicines are outlined above.  No orders of the defined types were placed in this encounter.  No orders of the defined types were placed in this encounter.   Patient Instructions  Dr Royann Shivers recommends that you schedule a follow-up appointment in 12 months. You will receive a reminder letter in the mail two months in advance. If you don't receive a letter, please call our office to schedule the follow-up appointment.  If you need a refill on your cardiac medications before your next appointment, please call your pharmacy.   Stay well hydrated.    Syncope Syncope is when you temporarily lose consciousness. Syncope may also be called fainting or passing out. It is caused by a sudden decrease in blood flow to the brain. Even though most causes of syncope are not dangerous, syncope can be a sign of a serious medical problem. Signs that you may be about to faint include:  Feeling dizzy or light-headed.  Feeling nauseous.  Seeing all white or all black in your field of vision.  Having cold, clammy skin.  If you fainted, get medical help right away.Call your local emergency services (911 in the [place] Do not drive yourself to the hospital. Follow these instructions at home: Pay attention to any changes in your symptoms. Take these actions to help with your condition:  Have someone stay with you until you feel stable.  Do not drive, use machinery, or play sports until your health care provider says it is okay.  Keep all follow-up visits as told by  your health care provider. This is important.  If you start to feel like you might faint, lie down right away and raise (elevate) your feet above the level of your heart. Breathe deeply and steadily. Wait until all of the symptoms have passed.  Drink enough fluid to keep your urine clear or pale yellow.  If you are taking blood pressure or heart medicine, get up slowly and take several minutes to sit and then stand. This can reduce dizziness.  Take over-the-counter and prescription medicines only as told by your health care provider.  Get help right away if:  You have a severe headache.  You have unusual pain in your chest, abdomen, or back.  You are bleeding from your mouth or rectum, or you have black or tarry stool.  You have a very fast or irregular heartbeat (palpitations).  You have pain with breathing.  You faint once or repeatedly.  You have a seizure.  You are confused.  You have trouble walking.  You have severe weakness.  You have vision problems. These symptoms may represent a serious problem that is an emergency. Do not wait to see if your symptoms will go away. Get medical help right away. Call your local emergency services (911 in the [place] Do not drive yourself to the hospital. This information is not intended to replace advice given to you by your health care provider. Make sure you discuss any questions you have with your health care provider. Document Released: 07/11/2005 Document Revised: 12/17/2015 Document Reviewed: 03/25/2015 Elsevier Interactive Patient Education  2018 [employer].     Signed, Thurmon Fair, MD  01/01/2018 6:16 PM    [facility]

## 2018-01-01 NOTE — Patient Instructions (Signed)
Dr Royann Shiversroitoru recommends that you schedule a follow-up appointment in 12 months. You will receive a reminder letter in the mail two months in advance. If you don't receive a letter, please call our office to schedule the follow-up appointment.  If you need a refill on your cardiac medications before your next appointment, please call your pharmacy.   Stay well hydrated.    Syncope Syncope is when you temporarily lose consciousness. Syncope may also be called fainting or passing out. It is caused by a sudden decrease in blood flow to the brain. Even though most causes of syncope are not dangerous, syncope can be a sign of a serious medical problem. Signs that you may be about to faint include:  Feeling dizzy or light-headed.  Feeling nauseous.  Seeing all white or all black in your field of vision.  Having cold, clammy skin.  If you fainted, get medical help right away.Call your local emergency services (911 in the [place] Do not drive yourself to the hospital. Follow these instructions at home: Pay attention to any changes in your symptoms. Take these actions to help with your condition:  Have someone stay with you until you feel stable.  Do not drive, use machinery, or play sports until your health care provider says it is okay.  Keep all follow-up visits as told by your health care provider. This is important.  If you start to feel like you might faint, lie down right away and raise (elevate) your feet above the level of your heart. Breathe deeply and steadily. Wait until all of the symptoms have passed.  Drink enough fluid to keep your urine clear or pale yellow.  If you are taking blood pressure or heart medicine, get up slowly and take several minutes to sit and then stand. This can reduce dizziness.  Take over-the-counter and prescription medicines only as told by your health care provider.  Get help right away if:  You have a severe headache.  You have unusual pain in  your chest, abdomen, or back.  You are bleeding from your mouth or rectum, or you have black or tarry stool.  You have a very fast or irregular heartbeat (palpitations).  You have pain with breathing.  You faint once or repeatedly.  You have a seizure.  You are confused.  You have trouble walking.  You have severe weakness.  You have vision problems. These symptoms may represent a serious problem that is an emergency. Do not wait to see if your symptoms will go away. Get medical help right away. Call your local emergency services (911 in the [place] Do not drive yourself to the hospital. This information is not intended to replace advice given to you by your health care provider. Make sure you discuss any questions you have with your health care provider. Document Released: 07/11/2005 Document Revised: 12/17/2015 Document Reviewed: 03/25/2015 Elsevier Interactive Patient Education  [employer]2018 [employer]

## 2018-02-28 ENCOUNTER — Ambulatory Visit: Payer: PRIVATE HEALTH INSURANCE | Admitting: Physician Assistant

## 2018-04-03 ENCOUNTER — Other Ambulatory Visit: Payer: Self-pay | Admitting: Physician Assistant

## 2018-04-03 DIAGNOSIS — I10 Essential (primary) hypertension: Secondary | ICD-10-CM

## 2018-04-03 NOTE — Telephone Encounter (Signed)
Amlodipine refill Last Refill:11/27/17 # 90 0 refills Last OV: 11/27/17 PCP: Barnett Abu Pharmacy:[facility] [address]

## 2018-05-29 ENCOUNTER — Emergency Department (HOSPITAL_COMMUNITY): Payer: No Typology Code available for payment source

## 2018-05-29 ENCOUNTER — Emergency Department (HOSPITAL_COMMUNITY)
Admission: EM | Admit: 2018-05-29 | Discharge: 2018-05-29 | Disposition: A | Discharge status: Home / Self Care | Payer: No Typology Code available for payment source | Attending: Emergency Medicine | Admitting: Emergency Medicine

## 2018-05-29 ENCOUNTER — Other Ambulatory Visit: Payer: Self-pay

## 2018-05-29 ENCOUNTER — Encounter (HOSPITAL_COMMUNITY): Payer: Self-pay | Admitting: Obstetrics and Gynecology

## 2018-05-29 DIAGNOSIS — R072 Precordial pain: Secondary | ICD-10-CM | POA: Diagnosis not present

## 2018-05-29 DIAGNOSIS — Z87891 Personal history of nicotine dependence: Secondary | ICD-10-CM | POA: Diagnosis not present

## 2018-05-29 DIAGNOSIS — R0789 Other chest pain: Secondary | ICD-10-CM | POA: Diagnosis present

## 2018-05-29 DIAGNOSIS — Z79899 Other long term (current) drug therapy: Secondary | ICD-10-CM | POA: Diagnosis not present

## 2018-05-29 DIAGNOSIS — K219 Gastro-esophageal reflux disease without esophagitis: Secondary | ICD-10-CM | POA: Diagnosis not present

## 2018-05-29 DIAGNOSIS — I10 Essential (primary) hypertension: Secondary | ICD-10-CM | POA: Diagnosis not present

## 2018-05-29 DIAGNOSIS — J45909 Unspecified asthma, uncomplicated: Secondary | ICD-10-CM | POA: Diagnosis not present

## 2018-05-29 LAB — BASIC METABOLIC PANEL
Anion gap: 6 (ref 5–15)
BUN: 12 mg/dL (ref 6–20)
CHLORIDE: 108 mmol/L (ref 98–111)
CO2: 24 mmol/L (ref 22–32)
Calcium: 8.9 mg/dL (ref 8.9–10.3)
Creatinine, Ser: 0.78 mg/dL (ref 0.44–1.00)
GFR calc Af Amer: >60 mL/min (ref >60)
GFR calc non Af Amer: >60 mL/min (ref >60)
GLUCOSE: 105 mg/dL — AB (ref 70–99)
Potassium: 3.8 mmol/L (ref 3.5–5.1)
SODIUM: 138 mmol/L (ref 135–145)

## 2018-05-29 LAB — CBC
HEMATOCRIT: 39.6 % (ref 36.0–46.0)
Hemoglobin: 12.4 g/dL (ref 12.0–15.0)
MCH: 31.8 pg (ref 26.0–34.0)
MCHC: 31.3 g/dL (ref 30.0–36.0)
MCV: 101.5 fL — ABNORMAL HIGH (ref 80.0–100.0)
Platelets: 264 10*3/uL (ref 150–400)
RBC: 3.9 MIL/uL (ref 3.87–5.11)
RDW: 11.8 % (ref 11.5–15.5)
WBC: 8.9 10*3/uL (ref 4.0–10.5)
nRBC: 0 % (ref 0.0–0.2)

## 2018-05-29 LAB — I-STAT TROPONIN, ED: Troponin i, poc: 0.01 ng/mL (ref 0.00–0.08)

## 2018-05-29 LAB — I-STAT BETA HCG BLOOD, ED (MC, WL, AP ONLY): I-stat hCG, quantitative: <5 m[IU]/mL (ref <5)

## 2018-05-29 MED ORDER — ALUM & MAG HYDROXIDE-SIMETH 200-200-20 MG/5ML PO SUSP
30.0000 mL | Freq: Once | ORAL | Status: AC
  Administered 2018-05-29: 30 mL via ORAL
  Filled 2018-05-29: qty 30

## 2018-05-29 MED ORDER — OMEPRAZOLE 20 MG PO CPDR: 20.0000 mg | DELAYED_RELEASE_CAPSULE | Freq: Every day | ORAL | 3 refills | Status: DC

## 2018-05-29 NOTE — ED Triage Notes (Signed)
Pt reports she has been having chest pain x3 days. Pt reports she has a hx of high blood pressure and is currently on amlodopine. Pt reports she took her BP this morning and it was 180/120. Pt reports she had a headache yesterday and nausea. Pt denies emesis.

## 2018-05-29 NOTE — ED Notes (Signed)
Patient transported to X-ray 

## 2018-05-29 NOTE — ED Provider Notes (Signed)
[facility]-EMERGENCY DEPT Provider Note   CSN: [id number] Arrival date & time: 05/29/18  0708     History   Chief Complaint Chief Complaint  Patient presents with  . Chest Pain  . Hypertension    HPI Amber Wells is a 27 y.o. female.  HPI Patient is a 27 year old female presents the emergency department with intermittent chest discomfort over the past 3 days.  She states it is sharp and located in her left chest with some radiation towards her right.  No significant shortness of breath.  No history of DVT or pulmonary embolism.  She does have a family history of early cardiac disease.  She states that tomato based food seems to make the symptoms worse.  No significant pleuritic component to her pain.  She is tried "the purple pill" without improvement in her symptoms when having active symptoms.  She has not tried any Tums or Maalox or other antacids.  Her symptoms are mild and intermittent.  No orthopnea.  No unilateral leg swelling.   Past Medical History:  Diagnosis Date  . Asthma   . Bronchitis   . Constipation   . Hypertension   . Smoker     Patient Active Problem List   Diagnosis Date Noted  . Essential hypertension 01/01/2018  . Near syncope 01/01/2018  . Chest pain, precordial 01/01/2018    Past Surgical History:  Procedure Laterality Date  . NO PAST SURGERIES       OB History    Gravida  0   Para      Term      Preterm      AB      Living        SAB      TAB      Ectopic      Multiple      Live Births               Home Medications    Prior to Admission medications   Medication Sig Start Date End Date Taking? Authorizing Provider  amLODipine (NORVASC) 5 MG tablet TAKE 1 TABLET BY MOUTH ONCE DAILY 04/03/18   Benjiman Core D, PA-C  omeprazole (PRILOSEC) 20 MG capsule Take 1 capsule (20 mg total) by mouth daily. 05/29/18   Azalia Bilis, MD  traMADol (ULTRAM) 50 MG tablet Take 1 tablet (50 mg total) by mouth  every 6 (six) hours as needed. 09/12/17   Benjiman Core, MD    Family History Family History  Problem Relation Age of Onset  . Hypertension Mother   . Diabetes Paternal Uncle   . Hypertension Father   . Hypertension Sister   . Lupus Sister   . Hypertension Maternal Grandmother   . Heart attack Maternal Grandfather        died after the 3rd heart attack at age 78  . Hypertension Paternal Grandmother     Social History Social History   Tobacco Use  . Smoking status: Former Smoker    Packs/day: 0.25  . Smokeless tobacco: Never Used  . Tobacco comment: since age 2  Substance Use Topics  . Alcohol use: Yes    Comment: socially  . Drug use: Yes    Frequency: 3.0 times per week    Types: Marijuana    Comment: Three times a day     Allergies   Patient has no known allergies.   Review of Systems Review of Systems  All other systems reviewed and are  negative.    Physical Exam Updated Vital Signs BP (!) 168/120   Pulse 64   Temp 97.6 F (36.4 C) (Oral)   Resp 18   Ht 5\' 6"  (1.676 m)   Wt 90.7 kg   LMP 05/02/2018 (Approximate)   SpO2 100%   BMI 32.28 kg/m   Physical Exam  Constitutional: She is oriented to person, place, and time. She appears well-developed and well-nourished. No distress.  HENT:  Head: Normocephalic and atraumatic.  Eyes: EOM are normal.  Neck: Normal range of motion.  Cardiovascular: Normal rate, regular rhythm and normal heart sounds.  Pulmonary/Chest: Effort normal and breath sounds normal.  Abdominal: Soft. She exhibits no distension. There is no tenderness.  Musculoskeletal: Normal range of motion.  Neurological: She is alert and oriented to person, place, and time.  Skin: Skin is warm and dry.  Psychiatric: She has a normal mood and affect. Judgment normal.  Nursing note and vitals reviewed.    ED Treatments / Results  Labs (all labs ordered are listed, but only abnormal results are displayed) Labs Reviewed  BASIC  METABOLIC PANEL - Abnormal; Notable for the following components:      Result Value   Glucose, Bld 105 (*)    All other components within normal limits  CBC - Abnormal; Notable for the following components:   MCV 101.5 (*)    All other components within normal limits  I-STAT TROPONIN, ED  I-STAT BETA HCG BLOOD, ED ([facility], [facility], [facility] ONLY)    EKG EKG Interpretation  Date/Time:  Tuesday May 29 2018 07:22:30 EST Ventricular Rate:  70 PR Interval:    QRS Duration: 86 QT Interval:  426 QTC Calculation: 460 R Axis:   53 Text Interpretation:  Sinus rhythm No significant change was found Confirmed by Azalia Bilis ([id number]) on 05/29/2018 8:03:49 AM   Radiology Dg Chest 2 View  Result Date: 05/29/2018 CLINICAL DATA:  Chest pain. EXAM: CHEST - 2 VIEW COMPARISON:  Radiographs of September 12, 2017. FINDINGS: The heart size and mediastinal contours are within normal limits. Both lungs are clear. No pneumothorax or pleural effusion is noted. The visualized skeletal structures are unremarkable. IMPRESSION: No active cardiopulmonary disease. Electronically Signed   By: Lupita Raider, M.D.   On: 05/29/2018 07:49    Procedures Procedures (including critical care time)  Medications Ordered in ED Medications  alum & mag hydroxide-simeth (MAALOX/MYLANTA) 200-200-20 MG/5ML suspension 30 mL (has no administration in time range)     Initial Impression / Assessment and Plan / ED Course  I have reviewed the triage vital signs and the nursing notes.  Pertinent labs & imaging results that were available during my care of the patient were reviewed by me and considered in my medical decision making (see chart for details).     Overall well-appearing.  Doubt ACS.  Doubt dissection.  Doubt PE/DVT.  No indication for additional work-up at this time.  Labs are reassuring.  EKG is reassuring.  Chest x-ray without abnormality.  Suspect gastroesophageal reflux disease.  Discussed the use of antacids as needed  as needed for active symptoms.  Daily Prilosec.  Outpatient GI follow-up if her symptoms do not improve.  Final Clinical Impressions(s) / ED Diagnoses   Final diagnoses:  Precordial chest pain  Gastroesophageal reflux disease without esophagitis    ED Discharge Orders         Ordered    omeprazole (PRILOSEC) 20 MG capsule  Daily     05/29/18 0814  Azalia Bilis, MD 05/29/18 [phone]

## 2018-05-29 NOTE — Discharge Instructions (Addendum)
Take Tums or Maalox as needed according to label instructions

## 2018-09-06 ENCOUNTER — Encounter (HOSPITAL_COMMUNITY): Payer: Self-pay

## 2018-09-06 ENCOUNTER — Other Ambulatory Visit: Payer: Self-pay

## 2018-09-06 ENCOUNTER — Emergency Department (HOSPITAL_COMMUNITY)
Admission: EM | Admit: 2018-09-06 | Discharge: 2018-09-07 | Disposition: A | Discharge status: Home / Self Care | Payer: PRIVATE HEALTH INSURANCE | Attending: Emergency Medicine | Admitting: Emergency Medicine

## 2018-09-06 ENCOUNTER — Emergency Department (HOSPITAL_COMMUNITY): Payer: PRIVATE HEALTH INSURANCE

## 2018-09-06 ENCOUNTER — Ambulatory Visit (HOSPITAL_COMMUNITY)
Admission: EM | Admit: 2018-09-06 | Discharge: 2018-09-06 | Disposition: A | Discharge status: Home / Self Care | Payer: PRIVATE HEALTH INSURANCE

## 2018-09-06 ENCOUNTER — Emergency Department (HOSPITAL_COMMUNITY)
Admission: EM | Admit: 2018-09-06 | Discharge: 2018-09-06 | Discharge status: Against Medical Advice | Payer: PRIVATE HEALTH INSURANCE

## 2018-09-06 DIAGNOSIS — R079 Chest pain, unspecified: Secondary | ICD-10-CM

## 2018-09-06 DIAGNOSIS — Z5321 Procedure and treatment not carried out due to patient leaving prior to being seen by health care provider: Secondary | ICD-10-CM | POA: Insufficient documentation

## 2018-09-06 LAB — I-STAT BETA HCG BLOOD, ED (MC, WL, AP ONLY): I-stat hCG, quantitative: <5 m[IU]/mL (ref <5)

## 2018-09-06 LAB — CBC
HCT: 37.6 % (ref 36.0–46.0)
Hemoglobin: 11.9 g/dL — ABNORMAL LOW (ref 12.0–15.0)
MCH: 31.2 pg (ref 26.0–34.0)
MCHC: 31.6 g/dL (ref 30.0–36.0)
MCV: 98.7 fL (ref 80.0–100.0)
Platelets: 283 10*3/uL (ref 150–400)
RBC: 3.81 MIL/uL — ABNORMAL LOW (ref 3.87–5.11)
RDW: 11.5 % (ref 11.5–15.5)
WBC: 9.6 10*3/uL (ref 4.0–10.5)
nRBC: 0 % (ref 0.0–0.2)

## 2018-09-06 LAB — BASIC METABOLIC PANEL
Anion gap: 10 (ref 5–15)
BUN: 9 mg/dL (ref 6–20)
CO2: 24 mmol/L (ref 22–32)
Calcium: 9.3 mg/dL (ref 8.9–10.3)
Chloride: 106 mmol/L (ref 98–111)
Creatinine, Ser: 0.99 mg/dL (ref 0.44–1.00)
GFR calc Af Amer: >60 mL/min (ref >60)
GFR calc non Af Amer: >60 mL/min (ref >60)
Glucose, Bld: 97 mg/dL (ref 70–99)
Potassium: 3.7 mmol/L (ref 3.5–5.1)
Sodium: 140 mmol/L (ref 135–145)

## 2018-09-06 LAB — I-STAT TROPONIN, ED: Troponin i, poc: 0.01 ng/mL (ref 0.00–0.08)

## 2018-09-06 NOTE — ED Notes (Signed)
PATIENT CALLED THREE TIMES WITH NO ANSWER 

## 2018-09-06 NOTE — ED Notes (Signed)
Pt here for chest pain, L sided, started today, states its "unbearable". States its getting worse and worse.

## 2018-09-06 NOTE — ED Triage Notes (Signed)
Pt arrives POV for eval of L sided intermittent chest pain. Pt reports sig cardiac hx in family w/ episodes of chest pain over last 2 years. Pt reports associated SOB earlier today which has since resolved. Denies diaphoresis, N/V. NARD, NAD in triage.

## 2018-09-06 NOTE — ED Triage Notes (Signed)
Pt c/o chest pain starting today. EKG obtained. Given to Dr. Delton See, Pt states she wants a cardiac rule out. Pt given information for ERs. Pt agreeable to plan, will head back to ER to be checked out.

## 2018-09-06 NOTE — ED Notes (Signed)
Pt called again for triage without response

## 2018-09-07 NOTE — ED Notes (Signed)
Unable to locate pt. at waiting area /traige area  multiple times by staff.

## 2018-10-31 ENCOUNTER — Other Ambulatory Visit: Payer: Self-pay | Admitting: General Practice

## 2018-10-31 DIAGNOSIS — I10 Essential (primary) hypertension: Secondary | ICD-10-CM

## 2018-11-17 ENCOUNTER — Other Ambulatory Visit: Payer: Self-pay | Admitting: Physician Assistant

## 2018-11-17 DIAGNOSIS — I10 Essential (primary) hypertension: Secondary | ICD-10-CM

## 2018-11-17 NOTE — Telephone Encounter (Signed)
Please advise 

## 2018-11-19 ENCOUNTER — Other Ambulatory Visit: Payer: Self-pay | Admitting: Physician Assistant

## 2018-11-19 DIAGNOSIS — I10 Essential (primary) hypertension: Secondary | ICD-10-CM

## 2018-11-21 ENCOUNTER — Telehealth (INDEPENDENT_AMBULATORY_CARE_PROVIDER_SITE_OTHER): Payer: PRIVATE HEALTH INSURANCE | Admitting: Registered Nurse

## 2018-11-21 ENCOUNTER — Other Ambulatory Visit: Payer: Self-pay

## 2018-11-21 ENCOUNTER — Encounter: Payer: Self-pay | Admitting: Registered Nurse

## 2018-11-21 VITALS — BP 169/90 | Ht 66.0 in | Wt 200.0 lb

## 2018-11-21 DIAGNOSIS — I10 Essential (primary) hypertension: Secondary | ICD-10-CM

## 2018-11-21 DIAGNOSIS — Z7689 Persons encountering health services in other specified circumstances: Secondary | ICD-10-CM

## 2018-11-21 MED ORDER — AMLODIPINE BESYLATE 10 MG PO TABS: 10.0000 mg | ORAL_TABLET | Freq: Every day | ORAL | 1 refills | Status: DC

## 2018-11-21 NOTE — Progress Notes (Signed)
Telemedicine Encounter- SOAP NOTE Established Patient  This telephone encounter was conducted with the patient's (or proxy's) verbal consent via audio telecommunications: yes   Patient was instructed to have this encounter in a suitably private space; and to only have persons present to whom they give permission to participate. In addition, patient identity was confirmed by use of name plus two identifiers (DOB and address).  I discussed the limitations, risks, security and privacy concerns of performing an evaluation and management service by telephone and the availability of in person appointments. I also discussed with the patient that there may be a patient responsible charge related to this service. The patient expressed understanding and agreed to proceed.  I spent a total of 20 minutes talking with the patient or their proxy.  CC: Establish care, HTN  Subjective   Amber Wells is a 28 y.o. established patient. Telephone visit today for essential hypertension, medication refills, and to establish care with myself as PCP  HTN: had been taking amlodipine 5mg  PO qd for HTN. However, pt monitors home BP and reports average readings of 160s/90s with some as high as 170s/120s. Denies sxs of cardiac event / CVA.  GERD: pt taking omeprazole 20mg  PO qd for GERD with good effect. Pt also reports losing 30lbs (down from 230 to 200) in previous months, which she says has helped greatly.  Establish care: former patient of [place], [place].     Patient Active Problem List   Diagnosis Date Noted  . Essential hypertension 01/01/2018  . Near syncope 01/01/2018  . Chest pain, precordial 01/01/2018    Past Medical History:  Diagnosis Date  . Asthma   . Bronchitis   . Constipation   . Hypertension   . Smoker     Current Outpatient Medications  Medication Sig Dispense Refill  . omeprazole (PRILOSEC) 20 MG capsule Take 1 capsule (20 mg total) by mouth daily. 30 capsule 3  .  amLODipine (NORVASC) 10 MG tablet Take 1 tablet (10 mg total) by mouth daily. 90 tablet 1  . traMADol (ULTRAM) 50 MG tablet Take 1 tablet (50 mg total) by mouth every 6 (six) hours as needed. 6 tablet 0   No current facility-administered medications for this visit.     No Known Allergies  Social History   Socioeconomic History  . Marital status: Single    Spouse name: Not on file  . Number of children: Not on file  . Years of education: Not on file  . Highest education level: Not on file  Occupational History  . Not on file  Social Needs  . Financial resource strain: Not on file  . Food insecurity:    Worry: Not on file    Inability: Not on file  . Transportation needs:    Medical: Not on file    Non-medical: Not on file  Tobacco Use  . Smoking status: Former Smoker    Packs/day: 0.25    Last attempt to quit: 11/20/2017    Years since quitting: 1.0  . Smokeless tobacco: Never Used  . Tobacco comment: since age 28  Substance and Sexual Activity  . Alcohol use: Yes    Comment: socially  . Drug use: Yes    Frequency: 3.0 times per week    Types: Marijuana    Comment: Three times a day  . Sexual activity: Not Currently    Birth control/protection: None  Lifestyle  . Physical activity:    Days per week: Not on  file    Minutes per session: Not on file  . Stress: Not on file  Relationships  . Social connections:    Talks on phone: Not on file    Gets together: Not on file    Attends religious service: Not on file    Active member of club or organization: Not on file    Attends meetings of clubs or organizations: Not on file    Relationship status: Not on file  . Intimate partner violence:    Fear of current or ex partner: Not on file    Emotionally abused: Not on file    Physically abused: Not on file    Forced sexual activity: Not on file  Other Topics Concern  . Not on file  Social History Narrative  . Not on file    Review of Systems  Constitutional:  Positive for weight loss (intentional). Negative for fever and malaise/fatigue.  Eyes: Negative for blurred vision.  Respiratory: Negative for cough and shortness of breath.   Cardiovascular: Negative for chest pain.  Gastrointestinal: Negative for constipation, diarrhea, nausea and vomiting.    Objective   Vitals as reported by the patient: Today's Vitals   11/21/18 0902  BP: (!) 169/90  Weight: 200 lb (90.7 kg)  Height: 5\' 6"  (1.676 m)    Diagnoses and all orders for this visit:  Encounter to establish care  Essential hypertension -     amLODipine (NORVASC) 10 MG tablet; Take 1 tablet (10 mg total) by mouth daily.  PLAN:  Will plan to increase amlodipine from 5mg  PO qd to 10mg  PO qd. 180 day supply given. Pt instructed to monitor home BP regularly with goal of under 140/90 agreed on. Patient is at low risk for hypotensive episodes and this is a modest increase in therapy, but hypotensive sxs were discussed and pt is instructed to contact clinic if she experiences any of these.   Continue on omeprazole 20mg  PO qd for GERD  Plan to come to clinic post COVID-19 for physical exam  Contact clinic in the meantime with any questions, comments, or concerns.   I discussed the assessment and treatment plan with the patient. The patient was provided an opportunity to ask questions and all were answered. The patient agreed with the plan and demonstrated an understanding of the instructions.   The patient was advised to call back or seek an in-person evaluation if the symptoms worsen or if the condition fails to improve as anticipated.  I provided 20 minutes of non-face-to-face time during this encounter.  Janeece Agee, NP  Primary [facility] at [facility]

## 2018-11-21 NOTE — Progress Notes (Signed)
CC-Establish care/Hypertension- Need a Refill  Amlodipine medication. Last blood pressure reading was 1 week ago and it was 179/122.

## 2018-11-21 NOTE — Patient Instructions (Signed)
° ° ° °  If you have lab work done today you will be contacted with your lab results within the next 2 weeks.  If you have not heard from us then please contact us. The fastest way to get your results is to register for My Chart. ° ° °IF you received an x-ray today, you will receive an invoice from [facility]. Please contact [facility] at [phone] with questions or concerns regarding your invoice.  ° °IF you received labwork today, you will receive an invoice from [facility]. Please contact [facility] at [phone] with questions or concerns regarding your invoice.  ° °Our billing staff will not be able to assist you with questions regarding bills from these companies. ° °You will be contacted with the lab results as soon as they are available. The fastest way to get your results is to activate your My Chart account. Instructions are located on the last page of this paperwork. If you have not heard from us regarding the results in 2 weeks, please contact this office. °  ° ° ° °

## 2018-11-23 ENCOUNTER — Telehealth: Payer: PRIVATE HEALTH INSURANCE | Admitting: Registered Nurse

## 2019-01-04 ENCOUNTER — Encounter: Payer: Self-pay | Admitting: Cardiovascular Disease

## 2019-01-04 ENCOUNTER — Telehealth (INDEPENDENT_AMBULATORY_CARE_PROVIDER_SITE_OTHER): Payer: Self-pay | Admitting: Cardiovascular Disease

## 2019-01-04 DIAGNOSIS — I10 Essential (primary) hypertension: Secondary | ICD-10-CM

## 2019-01-04 DIAGNOSIS — R55 Syncope and collapse: Secondary | ICD-10-CM

## 2019-01-04 NOTE — Patient Instructions (Signed)
Medication Instructions:  Your physician recommends that you continue on your current medications as directed. Please refer to the Current Medication list given to you today.  If you need a refill on your cardiac medications before your next appointment, please call your pharmacy.   Lab work: None ordered  Testing/Procedures: None ordered  Follow-Up: At [facility], you and your health needs are our priority.  As part of our continuing mission to provide you with exceptional heart care, we have created designated Provider Care Teams.  These Care Teams include your primary Cardiologist (physician) and Advanced Practice Providers (APPs -  Physician Assistants and Nurse Practitioners) who all work together to provide you with the care you need, when you need it. You will need a follow up appointment in 12 months.  Please call our office 2 months in advance to schedule this appointment.  You may see Azucena Kuba, MD or one of the following Advanced Practice Providers on your designated Care Team: Almyra Deforest, PA-C Fabian Sharp, [place]

## 2019-01-04 NOTE — Progress Notes (Signed)
Virtual Visit via Video Note   This visit type was conducted due to national recommendations for restrictions regarding the COVID-19 Pandemic (e.g. social distancing) in an effort to limit this patient's exposure and mitigate transmission in our community.  Due to her co-morbid illnesses, this patient is at least at moderate risk for complications without adequate follow up.  This format is felt to be most appropriate for this patient at this time.  All issues noted in this document were discussed and addressed.  A limited physical exam was performed with this format.  Please refer to the patient's chart for her consent to telehealth for [facility].   Date:  01/04/2019   ID:  Amber Wells, DOB [birth year], MRN [id number]  Patient Location: Home Provider Location: Home  PCP:  Maximiano Coss, NP  Cardiologist:  Tanita Palinkas Electrophysiologist:  None   Evaluation Performed:  Follow-Up Visit  Chief Complaint:  HTN, near syncope  History of Present Illness:    Amber Wells is a 28 y.o. female with essential hypertension, mild obesity, history of near syncope suggestive of a vasovagal event, here for routine follow-up.  She has been feeling much better.  She has not had any presyncopal events.  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  She has been running every day, including today (this is why her blood pressure and heart rate were fast when initially checked today;she just came in from a run).  Her typical blood pressure is around 112/90.  She has lost 30 pounds in weight.  The patient specifically denies any chest pain at rest exertion, dyspnea at rest or with exertion, orthopnea, paroxysmal nocturnal dyspnea, syncope, palpitations, focal neurological deficits, intermittent claudication, lower extremity edema, unexplained weight gain, cough, hemoptysis or wheezing.   The patient does not have symptoms concerning for COVID-19 infection (fever,  chills, cough, or new shortness of breath).    Past Medical History:  Diagnosis Date  . Asthma   . Bronchitis   . Constipation   . Hypertension   . Smoker    Past Surgical History:  Procedure Laterality Date  . NO PAST SURGERIES       Current Meds  Medication Sig  . amLODipine (NORVASC) 10 MG tablet Take 1 tablet (10 mg total) by mouth daily.  Marland Kitchen omeprazole (PRILOSEC) 20 MG capsule Take 1 capsule (20 mg total) by mouth daily.  . traMADol (ULTRAM) 50 MG tablet Take 1 tablet (50 mg total) by mouth every 6 (six) hours as needed.     Allergies:   Patient has no known allergies.   Social History   Tobacco Use  . Smoking status: Former Smoker    Packs/day: 0.25    Quit date: 11/20/2017    Years since quitting: 1.1  . Smokeless tobacco: Never Used  . Tobacco comment: since age 34  Substance Use Topics  . Alcohol use: Yes    Comment: socially  . Drug use: Yes    Frequency: 3.0 times per week    Types: Marijuana    Comment: Three times a day     Family Hx: The patient's family history includes Diabetes in her paternal uncle; Heart attack in her maternal grandfather; Hypertension in her father, maternal grandmother, mother, paternal grandmother, and sister; Lupus in her sister.  ROS:   Please see the history of present illness.     All other systems reviewed and are negative.   Prior CV studies:   The following  studies were reviewed today:  Labs/Other Tests and Data Reviewed:    EKG:  An ECG dated September 06, 2018 was personally reviewed today and demonstrated:  Normal sinus rhythm, normal tracing with isolated T wave inversion in lead III  Recent Labs: 09/06/2018: BUN 9; Creatinine, Ser 0.99; Hemoglobin 11.9; Platelets 283; Potassium 3.7; Sodium 140   Recent Lipid Panel No results found for: CHOL, TRIG, HDL, CHOLHDL, LDLCALC, LDLDIRECT  Wt Readings from Last 3 Encounters:  01/04/19 197 lb (89.4 kg)  11/21/18 200 lb (90.7 kg)  09/06/18 211 lb (95.7 kg)      Objective:    Vital Signs:  BP (!) 162/102   Pulse (!) 114   Ht 5\' 6"  (1.676 m)   Wt 197 lb (89.4 kg)   BMI 31.80 kg/m    VITAL SIGNS:  reviewed GEN:  no acute distress EYES:  sclerae anicteric, EOMI - Extraocular Movements Intact RESPIRATORY:  normal respiratory effort, symmetric expansion CARDIOVASCULAR:  no peripheral edema SKIN:  no rash, lesions or ulcers. MUSCULOSKELETAL:  no obvious deformities. NEURO:  alert and oriented x 3, no obvious focal deficit PSYCH:  normal affect Mildly obese  ASSESSMENT & PLAN:    1. HTN: Although her diastolic blood pressure remains a little high, control is drastically improved.  I suspect that as she continues to exercise on a regular basis and lose additional weight her blood pressure will be better and I do not want to make any changes in her medications today.  Reminded her about the importance of a sodium restricted diet. 2. Vasovagal near syncope: No recent episodes.  She is to stay very well-hydrated, especially while exercising in the heat.  Avoid diuretics for her blood pressure. 3. Mild obesity: She has done an excellent job with improved lifestyle and weight loss.  Encouraged to keep it up.  COVID-19 Education: The signs and symptoms of COVID-19 were discussed with the patient and how to seek care for testing (follow up with PCP or arrange E-visit).  The importance of social distancing was discussed today.  Time:   Today, I have spent 15 minutes with the patient with telehealth technology discussing the above problems.     Medication Adjustments/Labs and Tests Ordered: Current medicines are reviewed at length with the patient today.  Concerns regarding medicines are outlined above.   Tests Ordered: No orders of the defined types were placed in this encounter.   Medication Changes: No orders of the defined types were placed in this encounter.   Follow Up:  Virtual Visit or In Person 12 months  Signed, Amber FairMihai Natina Wiginton, MD   01/04/2019 11:34 AM    [facility]

## 2019-03-04 ENCOUNTER — Ambulatory Visit: Payer: PRIVATE HEALTH INSURANCE | Admitting: Registered Nurse

## 2019-03-13 ENCOUNTER — Ambulatory Visit: Payer: PRIVATE HEALTH INSURANCE | Admitting: Registered Nurse

## 2019-03-14 ENCOUNTER — Encounter: Payer: Self-pay | Admitting: Registered Nurse

## 2020-01-03 ENCOUNTER — Telehealth: Payer: Self-pay

## 2020-01-03 ENCOUNTER — Telehealth: Payer: Self-pay | Admitting: Cardiovascular Disease

## 2020-01-03 NOTE — Telephone Encounter (Signed)
Attempted to contact pt x 3 for scheduled virtual appointment today 6/11. Left detailed message asking patient to call back to reschedule.

## 2020-02-21 ENCOUNTER — Telehealth: Payer: Self-pay | Admitting: Cardiovascular Disease

## 2020-02-21 NOTE — Telephone Encounter (Signed)
LVM for patient to return call to get follow up scheduled with [place] 

## 2020-03-05 ENCOUNTER — Other Ambulatory Visit: Payer: Self-pay | Admitting: Registered Nurse

## 2020-03-05 DIAGNOSIS — I10 Essential (primary) hypertension: Secondary | ICD-10-CM

## 2020-06-06 ENCOUNTER — Emergency Department (HOSPITAL_COMMUNITY): Payer: Self-pay

## 2020-06-06 ENCOUNTER — Encounter (HOSPITAL_COMMUNITY): Payer: Self-pay

## 2020-06-06 ENCOUNTER — Emergency Department (HOSPITAL_COMMUNITY)
Admission: EM | Admit: 2020-06-06 | Discharge: 2020-06-06 | Disposition: A | Discharge status: Home / Self Care | Payer: Self-pay | Attending: Emergency Medicine | Admitting: Emergency Medicine

## 2020-06-06 ENCOUNTER — Other Ambulatory Visit: Payer: Self-pay

## 2020-06-06 DIAGNOSIS — I10 Essential (primary) hypertension: Secondary | ICD-10-CM | POA: Insufficient documentation

## 2020-06-06 DIAGNOSIS — Z87891 Personal history of nicotine dependence: Secondary | ICD-10-CM | POA: Diagnosis not present

## 2020-06-06 DIAGNOSIS — Z79899 Other long term (current) drug therapy: Secondary | ICD-10-CM | POA: Diagnosis not present

## 2020-06-06 DIAGNOSIS — S299XXA Unspecified injury of thorax, initial encounter: Secondary | ICD-10-CM | POA: Diagnosis not present

## 2020-06-06 DIAGNOSIS — S0990XA Unspecified injury of head, initial encounter: Secondary | ICD-10-CM | POA: Diagnosis not present

## 2020-06-06 DIAGNOSIS — S3991XA Unspecified injury of abdomen, initial encounter: Secondary | ICD-10-CM | POA: Insufficient documentation

## 2020-06-06 DIAGNOSIS — J45909 Unspecified asthma, uncomplicated: Secondary | ICD-10-CM | POA: Diagnosis not present

## 2020-06-06 DIAGNOSIS — Y92410 Unspecified street and highway as the place of occurrence of the external cause: Secondary | ICD-10-CM | POA: Insufficient documentation

## 2020-06-06 DIAGNOSIS — T1490XA Injury, unspecified, initial encounter: Secondary | ICD-10-CM

## 2020-06-06 LAB — RAPID URINE DRUG SCREEN, HOSP PERFORMED
Amphetamines: NOT DETECTED
Barbiturates: NOT DETECTED
Benzodiazepines: NOT DETECTED
Cocaine: NOT DETECTED
Opiates: NOT DETECTED
Tetrahydrocannabinol: POSITIVE — AB

## 2020-06-06 LAB — CBC WITH DIFFERENTIAL/PLATELET
Abs Immature Granulocytes: 0.06 10*3/uL (ref 0.00–0.07)
Basophils Absolute: 0.1 10*3/uL (ref 0.0–0.1)
Basophils Relative: 0 %
Eosinophils Absolute: 0.3 10*3/uL (ref 0.0–0.5)
Eosinophils Relative: 2 %
HCT: 40.4 % (ref 36.0–46.0)
Hemoglobin: 12.9 g/dL (ref 12.0–15.0)
Immature Granulocytes: 0 %
Lymphocytes Relative: 16 %
Lymphs Abs: 2.6 10*3/uL (ref 0.7–4.0)
MCH: 32.3 pg (ref 26.0–34.0)
MCHC: 31.9 g/dL (ref 30.0–36.0)
MCV: 101.3 fL — ABNORMAL HIGH (ref 80.0–100.0)
Monocytes Absolute: 1.1 10*3/uL — ABNORMAL HIGH (ref 0.1–1.0)
Monocytes Relative: 7 %
Neutro Abs: 12.2 10*3/uL — ABNORMAL HIGH (ref 1.7–7.7)
Neutrophils Relative %: 75 %
Platelets: 286 10*3/uL (ref 150–400)
RBC: 3.99 MIL/uL (ref 3.87–5.11)
RDW: 11.7 % (ref 11.5–15.5)
WBC: 16.2 10*3/uL — ABNORMAL HIGH (ref 4.0–10.5)
nRBC: 0 % (ref 0.0–0.2)

## 2020-06-06 LAB — HEPATIC FUNCTION PANEL
ALT: 18 U/L (ref 0–44)
AST: 20 U/L (ref 15–41)
Albumin: 3.9 g/dL (ref 3.5–5.0)
Alkaline Phosphatase: 44 U/L (ref 38–126)
Bilirubin, Direct: 0.1 mg/dL (ref 0.0–0.2)
Indirect Bilirubin: 0.4 mg/dL (ref 0.3–0.9)
Total Bilirubin: 0.5 mg/dL (ref 0.3–1.2)
Total Protein: 7 g/dL (ref 6.5–8.1)

## 2020-06-06 LAB — I-STAT CHEM 8, ED
BUN: 14 mg/dL (ref 6–20)
Calcium, Ion: 1.22 mmol/L (ref 1.15–1.40)
Chloride: 105 mmol/L (ref 98–111)
Creatinine, Ser: 0.9 mg/dL (ref 0.44–1.00)
Glucose, Bld: 96 mg/dL (ref 70–99)
HCT: 40 % (ref 36.0–46.0)
Hemoglobin: 13.6 g/dL (ref 12.0–15.0)
Potassium: 4.1 mmol/L (ref 3.5–5.1)
Sodium: 139 mmol/L (ref 135–145)
TCO2: 24 mmol/L (ref 22–32)

## 2020-06-06 LAB — BASIC METABOLIC PANEL
Anion gap: 9 (ref 5–15)
BUN: 12 mg/dL (ref 6–20)
CO2: 23 mmol/L (ref 22–32)
Calcium: 9.4 mg/dL (ref 8.9–10.3)
Chloride: 104 mmol/L (ref 98–111)
Creatinine, Ser: 0.96 mg/dL (ref 0.44–1.00)
GFR, Estimated: >60 mL/min (ref >60)
Glucose, Bld: 101 mg/dL — ABNORMAL HIGH (ref 70–99)
Potassium: 4.1 mmol/L (ref 3.5–5.1)
Sodium: 136 mmol/L (ref 135–145)

## 2020-06-06 LAB — I-STAT BETA HCG BLOOD, ED (MC, WL, AP ONLY): I-stat hCG, quantitative: <5 m[IU]/mL (ref <5)

## 2020-06-06 MED ORDER — CYCLOBENZAPRINE HCL 10 MG PO TABS: 10.0000 mg | ORAL_TABLET | Freq: Every day | ORAL | 0 refills | Status: AC

## 2020-06-06 MED ORDER — IOHEXOL 300 MG/ML  SOLN
100.0000 mL | Freq: Once | INTRAMUSCULAR | Status: AC | PRN
  Administered 2020-06-06: 100 mL via INTRAVENOUS

## 2020-06-06 MED ORDER — KETOROLAC TROMETHAMINE 30 MG/ML IJ SOLN
30.0000 mg | Freq: Once | INTRAMUSCULAR | Status: AC
  Administered 2020-06-06: 30 mg via INTRAVENOUS
  Filled 2020-06-06: qty 1

## 2020-06-06 MED ORDER — FENTANYL CITRATE (PF) 100 MCG/2ML IJ SOLN
50.0000 ug | Freq: Once | INTRAMUSCULAR | Status: AC
  Administered 2020-06-06: 50 ug via INTRAVENOUS
  Filled 2020-06-06: qty 2

## 2020-06-06 NOTE — Discharge Instructions (Addendum)
I recommend a combination of tylenol and ibuprofen for management of your pain. You can take a low dose of both at the same time. I recommend 325 mg of Tylenol combined with 400 mg of ibuprofen. This is one regular Tylenol and two regular ibuprofen. You can take these 2-3 times for day for your pain. Please try to take these medications with a small amount of food as well to prevent upsetting your stomach.  Also, please consider topical pain relieving creams such as Voltaran Gel, BioFreeze, or Icy Hot. There is also a pain relieving cream made by [employer]. You should be able to find all of these at your local pharmacy.   I am prescribing you a strong muscle relaxer called flexeril. Please only take this medication once in the evening with dinner. This medication can make you quite drowsy. Do not mix it with alcohol. Do not drive a vehicle after taking it.   Please return to the ER with any new or worsening symptoms.  It was a pleasure to meet you. 
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
## 2020-06-06 NOTE — ED Notes (Signed)
Patient verbalizes understanding of discharge instructions. Prescriptions reviewed. Opportunity for questioning and answers were provided. Armband removed by staff, pt discharged from ED ambulatory with significant other.

## 2020-06-16 ENCOUNTER — Ambulatory Visit: Payer: PRIVATE HEALTH INSURANCE | Admitting: Registered Nurse

## 2020-06-17 ENCOUNTER — Encounter: Payer: Self-pay | Admitting: Registered Nurse

## 2020-06-23 ENCOUNTER — Other Ambulatory Visit: Payer: Self-pay

## 2020-06-23 ENCOUNTER — Encounter: Payer: Self-pay | Admitting: Registered Nurse

## 2020-06-23 ENCOUNTER — Ambulatory Visit (INDEPENDENT_AMBULATORY_CARE_PROVIDER_SITE_OTHER): Payer: PRIVATE HEALTH INSURANCE | Admitting: Registered Nurse

## 2020-06-23 VITALS — BP 139/87 | HR 85 | Temp 98.5°F | Resp 18 | Ht 66.0 in | Wt 218.2 lb

## 2020-06-23 DIAGNOSIS — Z13228 Encounter for screening for other metabolic disorders: Secondary | ICD-10-CM

## 2020-06-23 DIAGNOSIS — Z1329 Encounter for screening for other suspected endocrine disorder: Secondary | ICD-10-CM | POA: Diagnosis not present

## 2020-06-23 DIAGNOSIS — Z13220 Encounter for screening for lipoid disorders: Secondary | ICD-10-CM | POA: Diagnosis not present

## 2020-06-23 DIAGNOSIS — Z130 Encounter for screening for diseases of the blood and blood-forming organs and certain disorders involving the immune mechanism: Secondary | ICD-10-CM

## 2020-06-23 DIAGNOSIS — I10 Essential (primary) hypertension: Secondary | ICD-10-CM

## 2020-06-23 DIAGNOSIS — R0789 Other chest pain: Secondary | ICD-10-CM | POA: Diagnosis not present

## 2020-06-23 DIAGNOSIS — M25531 Pain in right wrist: Secondary | ICD-10-CM

## 2020-06-23 DIAGNOSIS — M25532 Pain in left wrist: Secondary | ICD-10-CM

## 2020-06-23 MED ORDER — AMLODIPINE BESYLATE 10 MG PO TABS: 10.0000 mg | ORAL_TABLET | Freq: Every day | ORAL | 3 refills | Status: DC

## 2020-06-23 MED ORDER — TRAMADOL HCL 50 MG PO TABS: 50.0000 mg | ORAL_TABLET | Freq: Two times a day (BID) | ORAL | 0 refills | Status: AC | PRN

## 2020-06-23 MED ORDER — TRAMADOL HCL 50 MG PO TABS: 50.0000 mg | ORAL_TABLET | Freq: Four times a day (QID) | ORAL | 0 refills | Status: DC | PRN

## 2020-06-23 MED ORDER — PREDNISONE 10 MG (21) PO TBPK: ORAL_TABLET | ORAL | 0 refills | Status: DC

## 2020-06-23 MED ORDER — OMEPRAZOLE 20 MG PO CPDR: 20.0000 mg | DELAYED_RELEASE_CAPSULE | Freq: Every day | ORAL | 3 refills | Status: DC
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
## 2020-06-23 NOTE — Patient Instructions (Signed)
° ° ° °  If you have lab work done today you will be contacted with your lab results within the next 2 weeks.  If you have not heard from us then please contact us. The fastest way to get your results is to register for My Chart. ° ° °IF you received an x-ray today, you will receive an invoice from [facility]. Please contact [facility] at [phone] with questions or concerns regarding your invoice.  ° °IF you received labwork today, you will receive an invoice from [facility]. Please contact [facility] at [phone] with questions or concerns regarding your invoice.  ° °Our billing staff will not be able to assist you with questions regarding bills from these companies. ° °You will be contacted with the lab results as soon as they are available. The fastest way to get your results is to activate your My Chart account. Instructions are located on the last page of this paperwork. If you have not heard from us regarding the results in 2 weeks, please contact this office. °  ° ° ° °

## 2020-06-24 ENCOUNTER — Other Ambulatory Visit: Payer: Self-pay | Admitting: Registered Nurse

## 2020-06-24 DIAGNOSIS — R0789 Other chest pain: Secondary | ICD-10-CM

## 2020-06-24 DIAGNOSIS — M25531 Pain in right wrist: Secondary | ICD-10-CM

## 2020-06-24 LAB — CBC WITH DIFFERENTIAL
Basophils Absolute: 0 10*3/uL (ref 0.0–0.2)
Basos: 0 %
EOS (ABSOLUTE): 0.2 10*3/uL (ref 0.0–0.4)
Eos: 2 %
Hematocrit: 37.6 % (ref 34.0–46.6)
Hemoglobin: 12.8 g/dL (ref 11.1–15.9)
Immature Grans (Abs): 0 10*3/uL (ref 0.0–0.1)
Immature Granulocytes: 1 %
Lymphocytes Absolute: 2 10*3/uL (ref 0.7–3.1)
Lymphs: 23 %
MCH: 32.1 pg (ref 26.6–33.0)
MCHC: 34 g/dL (ref 31.5–35.7)
MCV: 94 fL (ref 79–97)
Monocytes Absolute: 0.6 10*3/uL (ref 0.1–0.9)
Monocytes: 7 %
Neutrophils Absolute: 5.9 10*3/uL (ref 1.4–7.0)
Neutrophils: 67 %
RBC: 3.99 x10E6/uL (ref 3.77–5.28)
RDW: 11.2 % — ABNORMAL LOW (ref 11.7–15.4)
WBC: 8.8 10*3/uL (ref 3.4–10.8)

## 2020-06-24 LAB — TSH: TSH: 0.837 u[IU]/mL (ref 0.450–4.500)

## 2020-06-24 LAB — COMPREHENSIVE METABOLIC PANEL
ALT: 16 IU/L (ref 0–32)
AST: 19 IU/L (ref 0–40)
Albumin/Globulin Ratio: 1.8 (ref 1.2–2.2)
Albumin: 4.4 g/dL (ref 3.8–4.8)
Alkaline Phosphatase: 57 IU/L (ref 44–121)
BUN/Creatinine Ratio: 13 (ref 9–23)
BUN: 11 mg/dL (ref 6–20)
Bilirubin Total: 0.3 mg/dL (ref 0.0–1.2)
CO2: 22 mmol/L (ref 20–29)
Calcium: 9.5 mg/dL (ref 8.7–10.2)
Chloride: 100 mmol/L (ref 96–106)
Creatinine, Ser: 0.86 mg/dL (ref 0.57–1.00)
GFR calc Af Amer: 104 mL/min/{1.73_m2} (ref >59)
GFR calc non Af Amer: 90 mL/min/{1.73_m2} (ref >59)
Globulin, Total: 2.4 g/dL (ref 1.5–4.5)
Glucose: 90 mg/dL (ref 65–99)
Potassium: 4.2 mmol/L (ref 3.5–5.2)
Sodium: 137 mmol/L (ref 134–144)
Total Protein: 6.8 g/dL (ref 6.0–8.5)

## 2020-06-24 LAB — LIPID PANEL
Chol/HDL Ratio: 3.4 ratio (ref 0.0–4.4)
Cholesterol, Total: 168 mg/dL (ref 100–199)
HDL: 50 mg/dL (ref >39)
LDL Chol Calc (NIH): 101 mg/dL — ABNORMAL HIGH (ref 0–99)
Triglycerides: 95 mg/dL (ref 0–149)
VLDL Cholesterol Cal: 17 mg/dL (ref 5–40)

## 2020-06-24 LAB — HEMOGLOBIN A1C
Est. average glucose Bld gHb Est-mCnc: 94 mg/dL
Hgb A1c MFr Bld: 4.9 % (ref 4.8–5.6)

## 2020-06-24 MED ORDER — OMEPRAZOLE 20 MG PO CPDR: 20.0000 mg | DELAYED_RELEASE_CAPSULE | Freq: Every day | ORAL | 3 refills | Status: DC

## 2020-06-24 NOTE — Progress Notes (Signed)
Hello,  Normal results letter, please.  Thanks,  Rich Amaad Byers, NP 

## 2020-06-25 ENCOUNTER — Encounter: Payer: Self-pay | Admitting: Radiology

## 2020-09-24 ENCOUNTER — Encounter: Payer: Self-pay | Admitting: Registered Nurse

## 2020-09-27 ENCOUNTER — Emergency Department (HOSPITAL_COMMUNITY)
Admission: EM | Admit: 2020-09-27 | Discharge: 2020-09-27 | Disposition: A | Discharge status: Home / Self Care | Payer: BLUE CROSS/BLUE SHIELD | Attending: Emergency Medicine | Admitting: Emergency Medicine

## 2020-09-27 ENCOUNTER — Emergency Department (HOSPITAL_COMMUNITY): Payer: BLUE CROSS/BLUE SHIELD

## 2020-09-27 ENCOUNTER — Other Ambulatory Visit: Payer: Self-pay

## 2020-09-27 ENCOUNTER — Encounter (HOSPITAL_COMMUNITY): Payer: Self-pay

## 2020-09-27 DIAGNOSIS — R110 Nausea: Secondary | ICD-10-CM | POA: Diagnosis not present

## 2020-09-27 DIAGNOSIS — I10 Essential (primary) hypertension: Secondary | ICD-10-CM | POA: Insufficient documentation

## 2020-09-27 DIAGNOSIS — Z87891 Personal history of nicotine dependence: Secondary | ICD-10-CM | POA: Insufficient documentation

## 2020-09-27 DIAGNOSIS — J45909 Unspecified asthma, uncomplicated: Secondary | ICD-10-CM | POA: Diagnosis not present

## 2020-09-27 DIAGNOSIS — R0789 Other chest pain: Secondary | ICD-10-CM | POA: Insufficient documentation

## 2020-09-27 DIAGNOSIS — R12 Heartburn: Secondary | ICD-10-CM | POA: Diagnosis not present

## 2020-09-27 DIAGNOSIS — R61 Generalized hyperhidrosis: Secondary | ICD-10-CM | POA: Diagnosis not present

## 2020-09-27 DIAGNOSIS — Z79899 Other long term (current) drug therapy: Secondary | ICD-10-CM | POA: Diagnosis not present

## 2020-09-27 LAB — CBC WITH DIFFERENTIAL/PLATELET
Abs Immature Granulocytes: 0.03 10*3/uL (ref 0.00–0.07)
Basophils Absolute: 0 10*3/uL (ref 0.0–0.1)
Basophils Relative: 0 %
Eosinophils Absolute: 0.2 10*3/uL (ref 0.0–0.5)
Eosinophils Relative: 2 %
HCT: 40.4 % (ref 36.0–46.0)
Hemoglobin: 13.1 g/dL (ref 12.0–15.0)
Immature Granulocytes: 0 %
Lymphocytes Relative: 24 %
Lymphs Abs: 2.3 10*3/uL (ref 0.7–4.0)
MCH: 32.3 pg (ref 26.0–34.0)
MCHC: 32.4 g/dL (ref 30.0–36.0)
MCV: 99.8 fL (ref 80.0–100.0)
Monocytes Absolute: 0.8 10*3/uL (ref 0.1–1.0)
Monocytes Relative: 8 %
Neutro Abs: 6.1 10*3/uL (ref 1.7–7.7)
Neutrophils Relative %: 66 %
Platelets: 280 10*3/uL (ref 150–400)
RBC: 4.05 MIL/uL (ref 3.87–5.11)
RDW: 11.9 % (ref 11.5–15.5)
WBC: 9.4 10*3/uL (ref 4.0–10.5)
nRBC: 0 % (ref 0.0–0.2)

## 2020-09-27 LAB — COMPREHENSIVE METABOLIC PANEL
ALT: 20 U/L (ref 0–44)
AST: 20 U/L (ref 15–41)
Albumin: 4.1 g/dL (ref 3.5–5.0)
Alkaline Phosphatase: 42 U/L (ref 38–126)
Anion gap: 7 (ref 5–15)
BUN: 14 mg/dL (ref 6–20)
CO2: 21 mmol/L — ABNORMAL LOW (ref 22–32)
Calcium: 9.2 mg/dL (ref 8.9–10.3)
Chloride: 107 mmol/L (ref 98–111)
Creatinine, Ser: 0.95 mg/dL (ref 0.44–1.00)
GFR, Estimated: >60 mL/min (ref >60)
Glucose, Bld: 98 mg/dL (ref 70–99)
Potassium: 3.9 mmol/L (ref 3.5–5.1)
Sodium: 135 mmol/L (ref 135–145)
Total Bilirubin: 0.9 mg/dL (ref 0.3–1.2)
Total Protein: 7.3 g/dL (ref 6.5–8.1)

## 2020-09-27 LAB — TROPONIN I (HIGH SENSITIVITY)
Troponin I (High Sensitivity): 2 ng/L (ref <18)
Troponin I (High Sensitivity): <2 ng/L (ref <18)

## 2020-09-27 LAB — LIPASE, BLOOD: Lipase: 27 U/L (ref 11–51)

## 2020-09-27 MED ORDER — ALBUTEROL SULFATE HFA 108 (90 BASE) MCG/ACT IN AERS
1.0000 | INHALATION_SPRAY | Freq: Once | RESPIRATORY_TRACT | Status: AC
  Administered 2020-09-27: 2 via RESPIRATORY_TRACT
  Filled 2020-09-27: qty 6.7

## 2020-09-27 MED ORDER — PANTOPRAZOLE SODIUM 40 MG IV SOLR
40.0000 mg | Freq: Once | INTRAVENOUS | Status: AC
  Administered 2020-09-27: 40 mg via INTRAVENOUS
  Filled 2020-09-27: qty 40

## 2020-09-27 MED ORDER — ALUM & MAG HYDROXIDE-SIMETH 200-200-20 MG/5ML PO SUSP
30.0000 mL | Freq: Once | ORAL | Status: AC
  Administered 2020-09-27: 30 mL via ORAL
  Filled 2020-09-27: qty 30

## 2020-09-27 MED ORDER — LIDOCAINE VISCOUS HCL 2 % MT SOLN
15.0000 mL | Freq: Once | OROMUCOSAL | Status: AC
  Administered 2020-09-27: 15 mL via ORAL
  Filled 2020-09-27: qty 15

## 2020-09-27 MED ORDER — AMLODIPINE BESYLATE 5 MG PO TABS
10.0000 mg | ORAL_TABLET | Freq: Once | ORAL | Status: AC
  Administered 2020-09-27: 10 mg via ORAL
  Filled 2020-09-27: qty 2

## 2020-09-27 MED ORDER — MORPHINE SULFATE (PF) 4 MG/ML IV SOLN
4.0000 mg | Freq: Once | INTRAVENOUS | Status: AC
  Administered 2020-09-27: 4 mg via INTRAVENOUS
  Filled 2020-09-27: qty 1

## 2020-09-27 MED ORDER — ONDANSETRON HCL 4 MG/2ML IJ SOLN
4.0000 mg | Freq: Once | INTRAMUSCULAR | Status: AC
  Administered 2020-09-27: 4 mg via INTRAVENOUS
  Filled 2020-09-27: qty 2

## 2020-09-27 MED ORDER — IOHEXOL 350 MG/ML SOLN
100.0000 mL | Freq: Once | INTRAVENOUS | Status: AC | PRN
  Administered 2020-09-27: 100 mL via INTRAVENOUS

## 2020-09-27 MED ORDER — SUCRALFATE 1 GM/10ML PO SUSP: 1.0000 g | Freq: Three times a day (TID) | ORAL | 0 refills | Status: DC

## 2020-09-27 NOTE — ED Notes (Signed)
Patient in room crying at this time due to being scared per patient. Provided education and tissues

## 2020-09-27 NOTE — ED Notes (Signed)
MD aware that GI cocktail with no relief. Patient still complaining of Burning sensation to chest

## 2020-09-27 NOTE — ED Provider Notes (Signed)
[facility]-EMERGENCY DEPT Provider Note   CSN: [id number] Arrival date & time: 09/27/20  1027     History Chief Complaint  Patient presents with  . Heartburn    Amber Wells is a 30 y.o. female with past medical history significant for asthma, hypertension, GERD.  HPI Patient presents to emergency room today with chief complaint of heartburn versus chest pain, acute onset starting 30 minutes ago.  Patient states she was at work chopping green beans when she had sudden onset of left-sided chest pain.  She describes it as a sharp sensation.  It radiates underneath her left breast.  Pain has been constant, rates pain 7/10 in severity.  She does also endorse associated nausea and diaphoresis with the pain.  She did not take any over-the-counter medications for symptoms prior to arrival. She admits to history of similar pain that was thought to be GERD. Patient admits to being under increased stress lately because of work.  She takes Prilosec for heartburn.  She states last night she drank a glass of red wine and ate pork which she usually does not eat. She also had a cup of coffee for breakfast this morning.  She forgot to take her blood pressure medicine and Prilosec because she was running late for work.  She has a history significant for family history of early cardiac disease. Admits to smoking marijuana occasionally, denies tobacco abuse or other substance abuse. She denies heavy alcohol consumption, does not drink daily.  Denies any recent travel.  No history of PE or DVT.  No exogenous estrogen use either.  Denies fever, chills, cough, hemoptysis, pleuritic chest pain,     Past Medical History:  Diagnosis Date  . Asthma   . Bronchitis   . Constipation   . Hypertension   . Smoker     Patient Active Problem List   Diagnosis Date Noted  . Essential hypertension 01/01/2018  . Near syncope 01/01/2018  . Chest pain, precordial 01/01/2018    Past Surgical  History:  Procedure Laterality Date  . NO PAST SURGERIES       OB History    Gravida  0   Para      Term      Preterm      AB      Living        SAB      IAB      Ectopic      Multiple      Live Births              Family History  Problem Relation Age of Onset  . Hypertension Mother   . Diabetes Paternal Uncle   . Hypertension Father   . Hypertension Sister   . Lupus Sister   . Hypertension Maternal Grandmother   . Heart attack Maternal Grandfather        died after the 3rd heart attack at age 32  . Hypertension Paternal Grandmother     Social History   Tobacco Use  . Smoking status: Former Smoker    Packs/day: 0.25    Quit date: 11/20/2017    Years since quitting: 2.8  . Smokeless tobacco: Never Used  . Tobacco comment: since age 30  Vaping Use  . Vaping Use: Never used  Substance Use Topics  . Alcohol use: Yes    Comment: socially  . Drug use: Yes    Frequency: 3.0 times per week    Types: Marijuana  Comment: Three times a day    Home Medications Prior to Admission medications   Medication Sig Start Date End Date Taking? Authorizing Provider  amLODipine (NORVASC) 10 MG tablet Take 1 tablet (10 mg total) by mouth daily. 06/23/20  Yes Janeece Agee, NP  omeprazole (PRILOSEC) 20 MG capsule Take 1 capsule (20 mg total) by mouth daily. 06/24/20  Yes Janeece Agee, NP  sucralfate (CARAFATE) 1 GM/10ML suspension Take 10 mLs (1 g total) by mouth 4 (four) times daily -  with meals and at bedtime. 09/27/20  Yes Walisiewicz, Ivalee Strauser E, PA-C  predniSONE (STERAPRED UNI-PAK 21 TAB) 10 MG (21) TBPK tablet Take per package instructions. Do not skip doses. Finish entire supply. Patient not taking: Reported on 09/27/2020 06/23/20   Janeece Agee, NP    Allergies    Patient has no known allergies.  Review of Systems   Review of Systems All other systems are reviewed and are negative for acute change except as noted in the HPI.  Physical  Exam Updated Vital Signs BP (!) 155/108   Pulse 68   Temp 98.4 F (36.9 C)   Resp 20   Ht 5\' 6"  (1.676 m)   Wt 99 kg   SpO2 98%   BMI 35.23 kg/m   Physical Exam Vitals and nursing note reviewed.  Constitutional:      General: She is not in acute distress.    Appearance: She is not ill-appearing.  HENT:     Head: Normocephalic and atraumatic.     Right Ear: Tympanic membrane and external ear normal.     Left Ear: Tympanic membrane and external ear normal.     Nose: Nose normal.     Mouth/Throat:     Mouth: Mucous membranes are moist.     Pharynx: Oropharynx is clear.  Eyes:     General: No scleral icterus.       Right eye: No discharge.        Left eye: No discharge.     Extraocular Movements: Extraocular movements intact.     Conjunctiva/sclera: Conjunctivae normal.     Pupils: Pupils are equal, round, and reactive to light.  Neck:     Vascular: No JVD.  Cardiovascular:     Rate and Rhythm: Normal rate and regular rhythm.     Pulses: Normal pulses.          Radial pulses are 2+ on the right side and 2+ on the left side.     Heart sounds: Normal heart sounds.  Pulmonary:     Comments: Lungs clear to auscultation in all fields. Symmetric chest rise. No wheezing, rales, or rhonchi. Chest:       Comments: Chest pain reproducible on exam. No flail chest, no crepitus or deformity. Abdominal:     Comments: Abdomen is soft, non-distended, and non-tender in all quadrants. No rigidity, no guarding. No peritoneal signs.  Musculoskeletal:        General: Normal range of motion.     Cervical back: Normal range of motion.     Right lower leg: No edema.     Left lower leg: No edema.  Skin:    General: Skin is warm and dry.     Capillary Refill: Capillary refill takes less than 2 seconds.  Neurological:     Mental Status: She is oriented to person, place, and time.     GCS: GCS eye subscore is 4. GCS verbal subscore is 5. GCS motor subscore is 6.  Comments: Fluent  speech, no facial droop.  Psychiatric:        Behavior: Behavior normal.     ED Results / Procedures / Treatments   Labs (all labs ordered are listed, but only abnormal results are displayed) Labs Reviewed  COMPREHENSIVE METABOLIC PANEL - Abnormal; Notable for the following components:      Result Value   CO2 21 (*)    All other components within normal limits  CBC WITH DIFFERENTIAL/PLATELET  LIPASE, BLOOD  TROPONIN I (HIGH SENSITIVITY)  TROPONIN I (HIGH SENSITIVITY)    EKG EKG Interpretation  Date/Time:  Sunday September 27 2020 10:48:09 EST Ventricular Rate:  64 PR Interval:    QRS Duration: 91 QT Interval:  403 QTC Calculation: 416 R Axis:   57 Text Interpretation: Sinus rhythm Atrial premature complex Confirmed by Virgina Norfolk [phone]) on 09/27/2020 10:49:38 AM   EKG Interpretation  Date/Time:  Sunday September 27 2020 12:05:19 EST Ventricular Rate:  65 PR Interval:    QRS Duration: 87 QT Interval:  423 QTC Calculation: 440 R Axis:   44 Text Interpretation: Sinus rhythm Confirmed by Virgina Norfolk [phone]) on 09/27/2020 12:11:52 PM        Radiology CT Angio Chest PE W/Cm &/Or Wo Cm  Result Date: 09/27/2020 CLINICAL DATA:  Chest pain. EXAM: CT ANGIOGRAPHY CHEST WITH CONTRAST TECHNIQUE: Multidetector CT imaging of the chest was performed using the standard protocol during bolus administration of intravenous contrast. Multiplanar CT image reconstructions and MIPs were obtained to evaluate the vascular anatomy. CONTRAST:  OMNIPAQUE IOHEXOL 350 MG/ML SOLN COMPARISON:  June 06, 2020 FINDINGS: Cardiovascular: Satisfactory opacification of the pulmonary arteries to the segmental level. No evidence of pulmonary embolism. Normal heart size. No pericardial effusion. Mediastinum/Nodes: No enlarged mediastinal, hilar, or axillary lymph nodes. Thyroid gland, trachea, and esophagus demonstrate no significant findings. Lungs/Pleura: Lungs are clear. No pleural effusion or  pneumothorax. Upper Abdomen: No acute abnormality. Musculoskeletal: No chest wall abnormality. No acute or significant osseous findings. Review of the MIP images confirms the above findings. IMPRESSION: No pulmonary emboli.  No cause for pain identified. Electronically Signed   By: Gerome Sam III M.D   On: 09/27/2020 13:25   DG Chest Portable 1 View  Result Date: 09/27/2020 CLINICAL DATA:  30 year old female with chest pain. EXAM: PORTABLE CHEST 1 VIEW COMPARISON:  06/06/2020 FINDINGS: The heart size and mediastinal contours are within normal limits. Both lungs are clear. The visualized skeletal structures are unremarkable. IMPRESSION: No acute cardiopulmonary process. Electronically Signed   By: Marliss Coots MD   On: 09/27/2020 11:15    Procedures Procedures   Medications Ordered in ED Medications  amLODipine (NORVASC) tablet 10 mg (10 mg Oral Given 09/27/20 1053)  alum & mag hydroxide-simeth (MAALOX/MYLANTA) 200-200-20 MG/5ML suspension 30 mL (30 mLs Oral Given 09/27/20 1054)    And  lidocaine (XYLOCAINE) 2 % viscous mouth solution 15 mL (15 mLs Oral Given 09/27/20 1054)  pantoprazole (PROTONIX) injection 40 mg (40 mg Intravenous Given 09/27/20 1208)  morphine 4 MG/ML injection 4 mg (4 mg Intravenous Given 09/27/20 1222)  ondansetron (ZOFRAN) injection 4 mg (4 mg Intravenous Given 09/27/20 1222)  iohexol (OMNIPAQUE) 350 MG/ML injection 100 mL (100 mLs Intravenous Contrast Given 09/27/20 1238)  albuterol (VENTOLIN HFA) 108 (90 Base) MCG/ACT inhaler 1-2 puff (2 puffs Inhalation Given 09/27/20 1336)    ED Course  I have reviewed the triage vital signs and the nursing notes.  Pertinent labs & imaging results that were available during  my care of the patient were reviewed by me and considered in my medical decision making (see chart for details).    MDM Rules/Calculators/A&P                          History provided by patient with additional history obtained from chart review.    30 year old  female presenting with chest pain.  She is afebrile, she was noted to be tensive in triage to 155/108, did not take amlodipine this morning, no tachycardia or hypoxia.  On exam she has tenderness palpation of left side of chest.  She has normal work of breathing and her lungs are clear to auscultation all fields.  No abdominal tenderness, no peritoneal signs.  She has history of GERD and was stating this feels similar therefore work-up initiated with basic labs, troponin chest x-ray, EKG.  She was placed on cardiac monitor and GI cocktail.  EKG unremarkable, no obvious ischemia.  CBC and CMP overall unremarkable.  Lipase within normal range.  First troponin is 2. Reassessed patient after GI cocktail.  She is still endorsing intermittent chest pain, she feels like it has been getting worse. She looks uncomfortable, grabbing chest. Repeat EKG is normal.  Given Protonix and morphine for pain.  She is still pending second troponin at this time. Given acute change in symptoms CTA chest obtained and no acute findings.  Negative for PE.  No signs of pneumonia pneumothorax as well.  Delta troponin unremarkable.  Updated patient on results and engaged in shared decision making.  Patient feels she can manage symptoms at home.  Recommend she increase her Prilosec and prescription sent to her pharmacy for Carafate.  Patient given information for outpatient GI follow-up.  The patient appears reasonably screened and/or stabilized for discharge and I doubt any other medical condition or other [facility] requiring further screening, evaluation, or treatment in the ED at this time prior to discharge. The patient is safe for discharge with strict return precautions discussed. Recommend pcp follow up for blood pressure recheck as well. The patient was discussed with and seen by ED supervising physician Dr. Lockie Mola who agrees with the treatment plan.    Portions of this note were generated with Scientist, clinical (histocompatibility and immunogenetics). Dictation  errors may occur despite best attempts at proofreading.   Final Clinical Impression(s) / ED Diagnoses Final diagnoses:  Atypical chest pain    Rx / DC Orders ED Discharge Orders         Ordered    sucralfate (CARAFATE) 1 GM/10ML suspension  3 times daily with meals & bedtime        09/27/20 1422           Shanon Ace, PA-C 09/27/20 1459    Virgina Norfolk, DO 09/28/20 [phone]

## 2020-09-27 NOTE — ED Notes (Signed)
PA made aware that after morphine pain level increased to 6/10 from 5/10 and pain is no longer a burning sensation, pt states "I feel like someone is sitting on my chest or like I am having a asthma attack from the squeezing feeling in my chest". MD and PA in room

## 2020-09-27 NOTE — Discharge Instructions (Addendum)
-  Prescription sent to the pharmacy for Carafate.  As we discussed this is a medicine to help with heartburn.  Take it with meals as prescribed.  -You can try to increase your Prilosec dose to take prilosec 20 mg in the morning and at night.  -Return to emergency department for new or worsening symptoms.

## 2020-09-27 NOTE — ED Provider Notes (Signed)
I personally evaluated the patient during the encounter and completed a history, physical, procedures, medical decision making to contribute to the overall care of the patient and decision making for the patient briefly, the patient is a 30 y.o. female here with chest pain.  History of reflux.  Overall unremarkable vitals.  Having some intermittent chest pain.  Had some wine and pork last night and has heartburn type symptoms today.  No cardiac risk factors.  No PE risk factors.  However she does appear uncomfortable.  EKG shows sinus rhythm.  No ischemic changes.  Troponin negative and will get second troponin.  Low suspicion for ACS and a troponin is clear suspect she can follow-up with primary care doctor.  Will rule out PE with CT scan.  Otherwise no significant anemia, electrolyte abnormality.  Will give GI cocktail.  Anticipate discharge to home with increased dose of her omeprazole, GI follow-up, Carafate.  Chest x-ray negative for pneumonia/pneumothorax.  This chart was dictated using voice recognition software.  Despite best efforts to proofread,  errors can occur which can change the documentation meaning.     EKG Interpretation  Date/Time:  Sunday September 27 2020 12:05:19 EST Ventricular Rate:  65 PR Interval:    QRS Duration: 87 QT Interval:  423 QTC Calculation: 440 R Axis:   44 Text Interpretation: Sinus rhythm Confirmed by Virgina Norfolk [phone]) on 09/27/2020 12:11:52 PM           Virgina Norfolk, DO 09/27/20 1322

## 2020-09-27 NOTE — ED Triage Notes (Signed)
BIB EMS from work. Patient reports not taking her Norvasc or omepezole this am. Patient reports burning sensation substernal chest.

## 2020-09-27 NOTE — ED Notes (Signed)
Visitor in room with patient

## 2021-05-27 ENCOUNTER — Other Ambulatory Visit: Payer: Self-pay

## 2021-05-27 ENCOUNTER — Encounter: Payer: Self-pay | Admitting: Registered Nurse

## 2021-05-27 ENCOUNTER — Telehealth (INDEPENDENT_AMBULATORY_CARE_PROVIDER_SITE_OTHER): Payer: BLUE CROSS/BLUE SHIELD | Admitting: Registered Nurse

## 2021-05-27 DIAGNOSIS — I10 Essential (primary) hypertension: Secondary | ICD-10-CM

## 2021-05-27 DIAGNOSIS — R0789 Other chest pain: Secondary | ICD-10-CM

## 2021-05-27 MED ORDER — AMLODIPINE BESYLATE 10 MG PO TABS: 10.0000 mg | ORAL_TABLET | Freq: Every day | ORAL | 0 refills | Status: AC

## 2021-05-27 MED ORDER — OMEPRAZOLE 20 MG PO CPDR: 20.0000 mg | DELAYED_RELEASE_CAPSULE | Freq: Every day | ORAL | 0 refills | Status: AC

## 2021-05-27 NOTE — Progress Notes (Signed)
Telemedicine Encounter- SOAP NOTE Established Patient  This video encounter was conducted with the patient's (or proxy's) verbal consent via audio telecommunications: yes/no: Yes Patient was instructed to have this encounter in a suitably private space; and to only have persons present to whom they give permission to participate. In addition, patient identity was confirmed by use of name plus two identifiers (DOB and address).  I discussed the limitations, risks, security and privacy concerns of performing an evaluation and management service by telephone and the availability of in person appointments. I also discussed with the patient that there may be a patient responsible charge related to this service. The patient expressed understanding and agreed to proceed.  I spent a total of 17 minutes talking with the patient or their proxy.  Patient at home Provider in office  Participants: Jari Sportsman, NP and Emelda Brothers  Chief Complaint  Patient presents with   Follow-up    Med recheck    Subjective   Amber Wells is a 30 y.o. established patient. Video visit today for med management  HPI Notes she has run out of bp meds. Was seen at urgent care for UTI, noted to be hypertensive to 150/102 No symptoms. Tolerated amlodipine 10mg  po qd well in past. Notes she has moved to [place] - she will make next visit in person.  Gerd - using omperazole 20mg  po qd daily Able to discuss lifestyle modifications for gerd relief. No melena, new pain, nvd  Patient Active Problem List   Diagnosis Date Noted   Essential hypertension 01/01/2018   Near syncope 01/01/2018   Chest pain, precordial 01/01/2018    Past Medical History:  Diagnosis Date   Asthma    Bronchitis    Constipation    Hypertension    Smoker     Current Outpatient Medications  Medication Sig Dispense Refill   amLODipine (NORVASC) 10 MG tablet Take 1 tablet (10 mg total) by mouth daily. 90 tablet 3   omeprazole  (PRILOSEC) 20 MG capsule Take 1 capsule (20 mg total) by mouth daily. 90 capsule 3   predniSONE (STERAPRED UNI-PAK 21 TAB) 10 MG (21) TBPK tablet Take per package instructions. Do not skip doses. Finish entire supply. (Patient not taking: No sig reported) 1 each 0   sucralfate (CARAFATE) 1 GM/10ML suspension Take 10 mLs (1 g total) by mouth 4 (four) times daily -  with meals and at bedtime. (Patient not taking: Reported on 05/27/2021) 420 mL 0   No current facility-administered medications for this visit.    No Known Allergies  Social History   Socioeconomic History   Marital status: Single    Spouse name: Not on file   Number of children: Not on file   Years of education: Not on file   Highest education level: Not on file  Occupational History   Not on file  Tobacco Use   Smoking status: Former    Packs/day: 0.25    Types: Cigarettes    Quit date: 11/20/2017    Years since quitting: 3.5   Smokeless tobacco: Never   Tobacco comments:    since age 75  Vaping Use   Vaping Use: Never used  Substance and Sexual Activity   Alcohol use: Yes    Comment: socially   Drug use: Yes    Frequency: 3.0 times per week    Types: Marijuana    Comment: Three times a day   Sexual activity: Not Currently    Birth control/protection: None  Other Topics Concern   Not on file  Social History Narrative   Not on file   Social Determinants of Health   Financial Resource Strain: Not on file  Food Insecurity: Not on file  Transportation Needs: Not on file  Physical Activity: Not on file  Stress: Not on file  Social Connections: Not on file  Intimate Partner Violence: Not on file    Review of Systems  Constitutional: Negative.   HENT: Negative.    Eyes: Negative.   Respiratory: Negative.    Cardiovascular: Negative.   Gastrointestinal: Negative.   Genitourinary: Negative.   Musculoskeletal: Negative.   Skin: Negative.   Neurological: Negative.   Endo/Heme/Allergies: Negative.    Psychiatric/Behavioral: Negative.    All other systems reviewed and are negative.  Objective   Vitals as reported by the patient: Today's Vitals   05/27/21 1223  BP: (!) 150/102    There are no diagnoses linked to this encounter.  PLAN Refill x 3 mo. She will have in person visit with labs within that time. Pt voices understanding of plan Patient encouraged to call clinic with any questions, comments, or concerns.  I discussed the assessment and treatment plan with the patient. The patient was provided an opportunity to ask questions and all were answered. The patient agreed with the plan and demonstrated an understanding of the instructions.   The patient was advised to call back or seek an in-person evaluation if the symptoms worsen or if the condition fails to improve as anticipated.  I provided 14 minutes of face-to-face time during this encounter.  Janeece Agee, NP

## 2021-05-27 NOTE — Progress Notes (Signed)
I connected with  Amber Wells on 05/27/21 by a video enabled telemedicine application and verified that I am speaking with the correct person using two identifiers.   I discussed the limitations of evaluation and management by telemedicine. The patient expressed understanding and agreed to proceed.

## 2022-09-24 IMAGING — CT CT ANGIO CHEST
2 of 6 series · 18 of 36 positions shown · IV contrast (omnipaque)
Comparison: June 06, 2020

CLINICAL DATA: Chest pain.

EXAM:
CT ANGIOGRAPHY CHEST WITH CONTRAST
TECHNIQUE: Multidetector CT imaging of the chest was performed using the
standard protocol during bolus administration of intravenous
contrast. Multiplanar CT image reconstructions and MIPs were
obtained to evaluate the vascular anatomy.
CONTRAST:  100mL OMNIPAQUE IOHEXOL 350 MG/ML SOLN

[Series 6: thins · axial · 0.71mm/px · z∈[+1453,+1703]mm · 17 of 282 slices shown]
[im 16/282  lung]
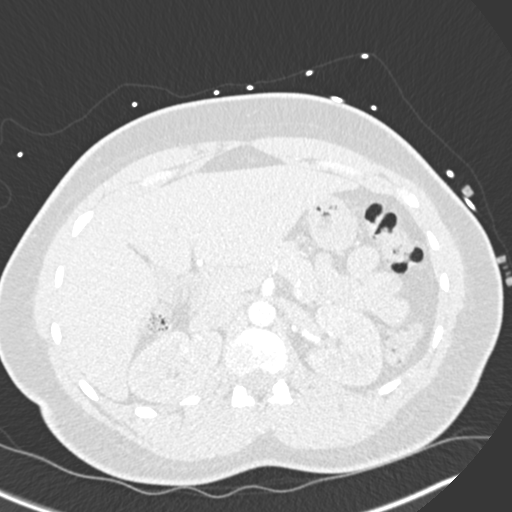
[im 32/282  mediastinal]
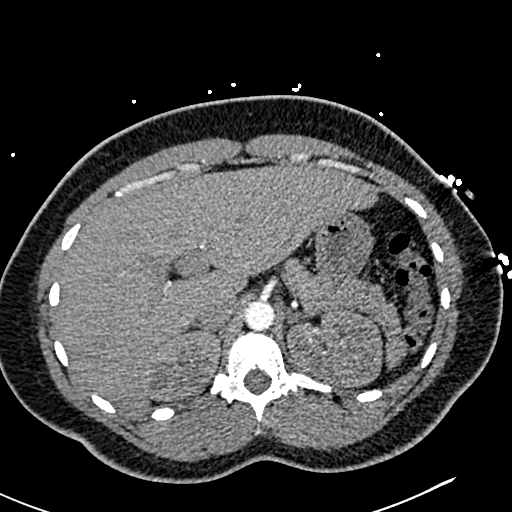
[im 47/282  lung]
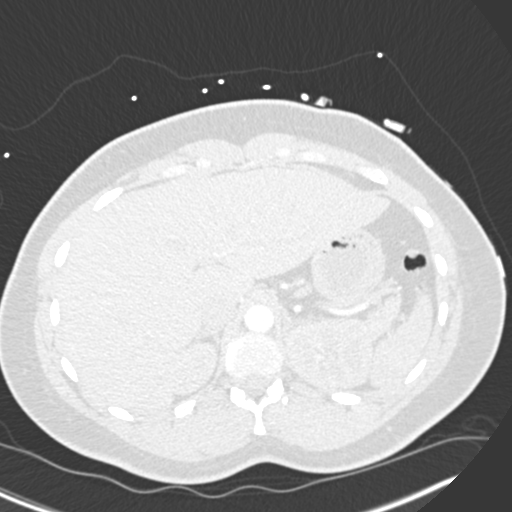
[im 63/282  mediastinal]
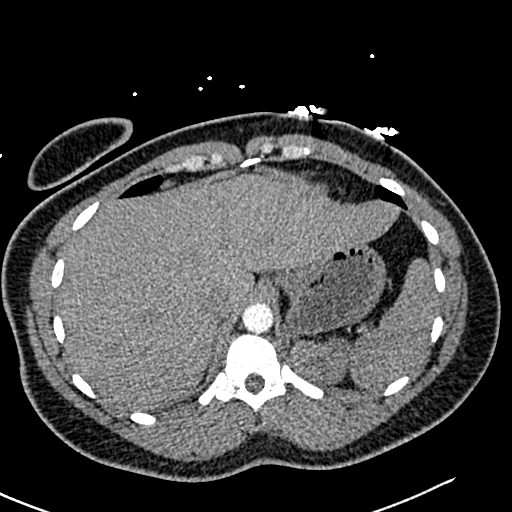
[im 79/282  lung]
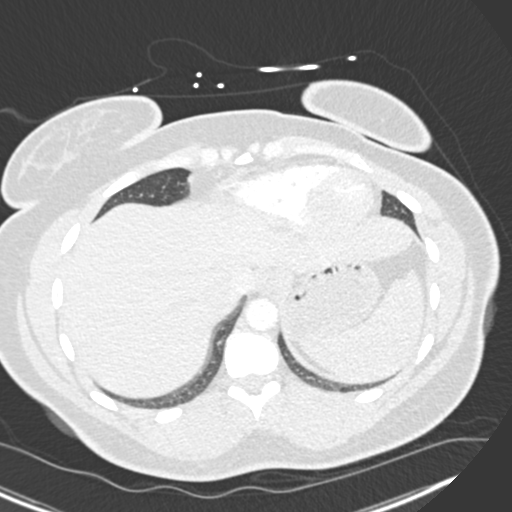
[im 94/282  mediastinal]
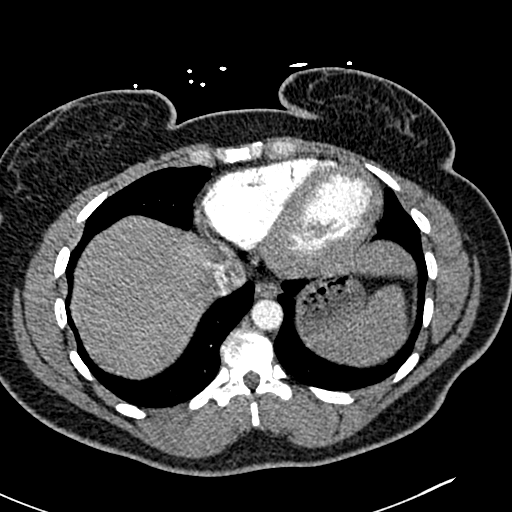
[im 110/282  lung]
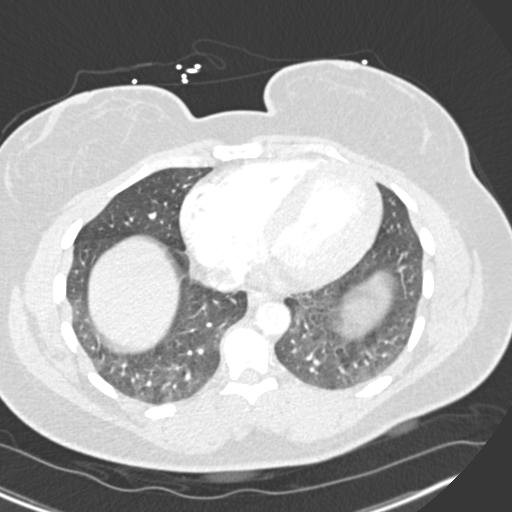
[im 125/282  mediastinal]
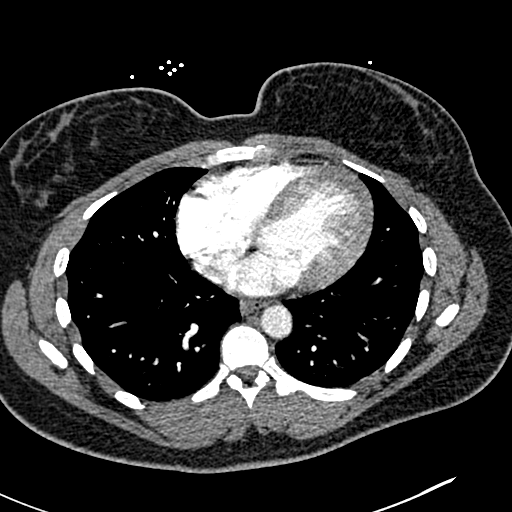
[im 141/282  lung]
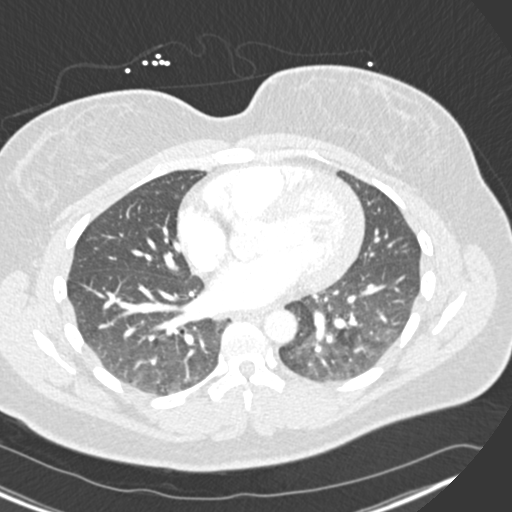
[im 157/282  mediastinal]
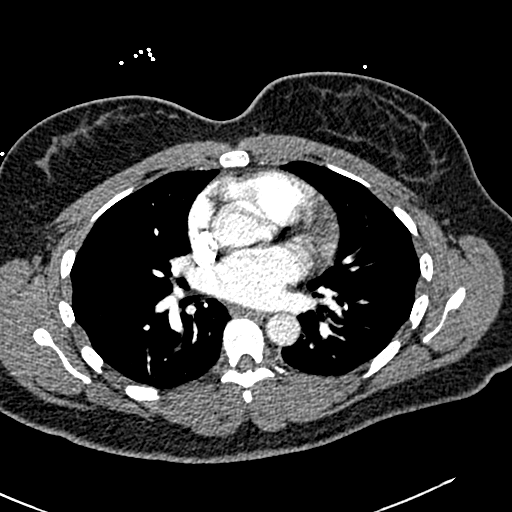
[im 172/282  lung]
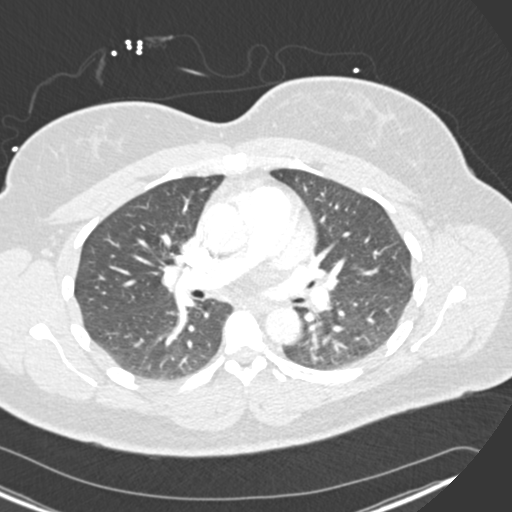
[im 188/282  mediastinal]
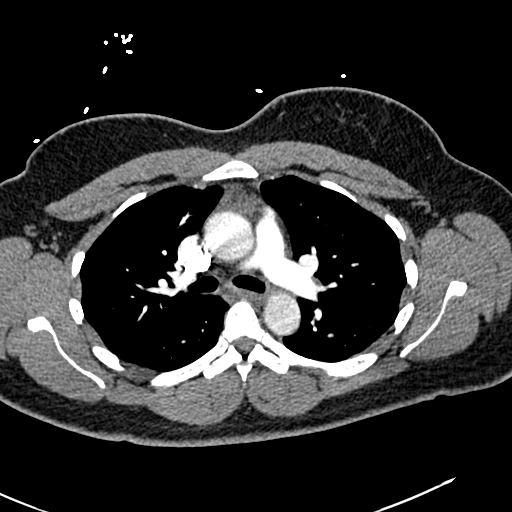
[im 203/282  lung]
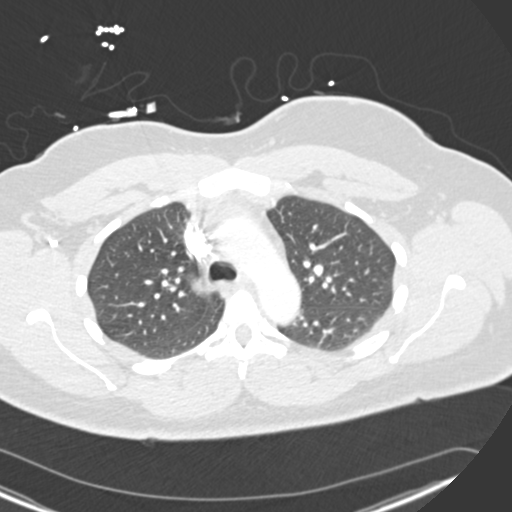
[im 219/282  mediastinal]
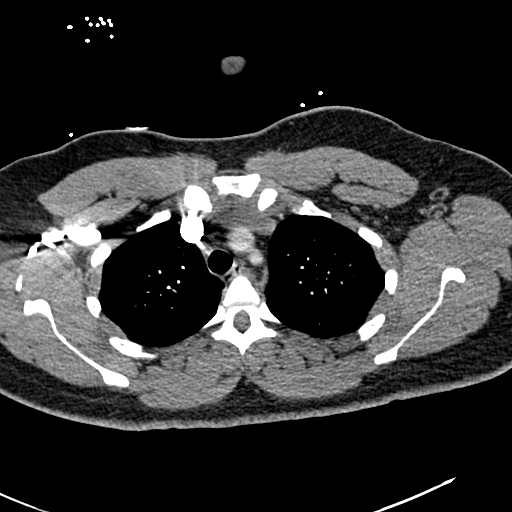
[im 235/282  lung]
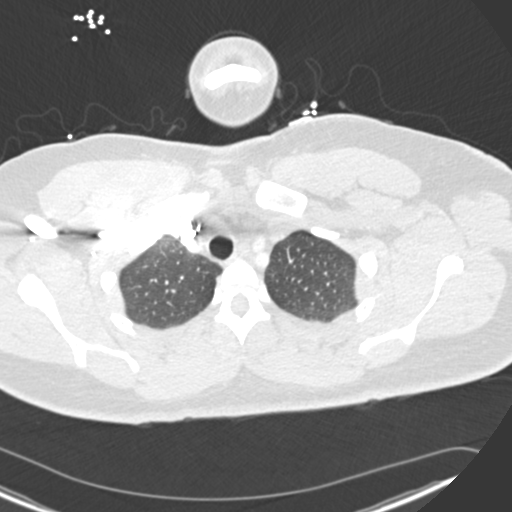
[im 250/282  mediastinal]
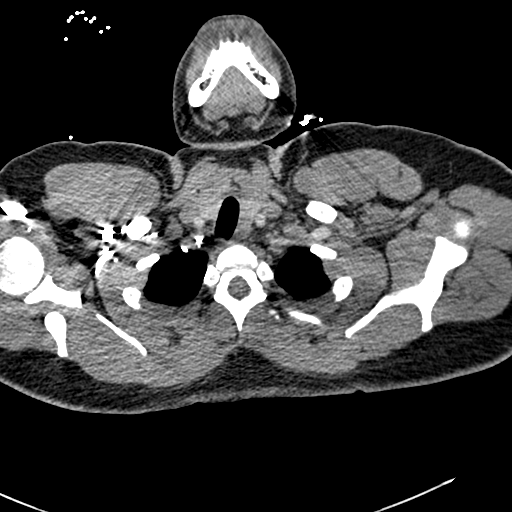
[im 266/282  lung]
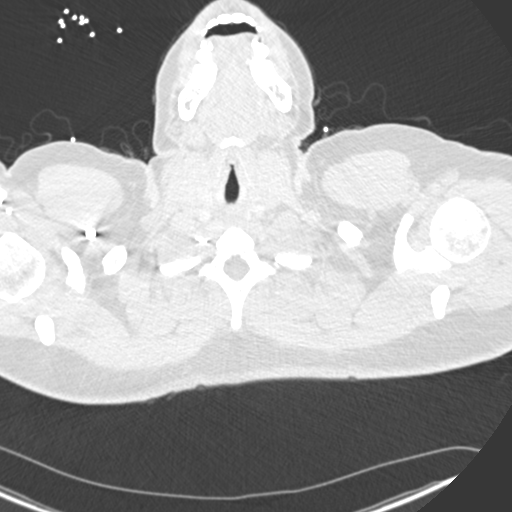

[Series 8: coronal mpr · coronal · 0.56mm/px · 1 of 146 slices shown]
[im 73/146  mediastinal]
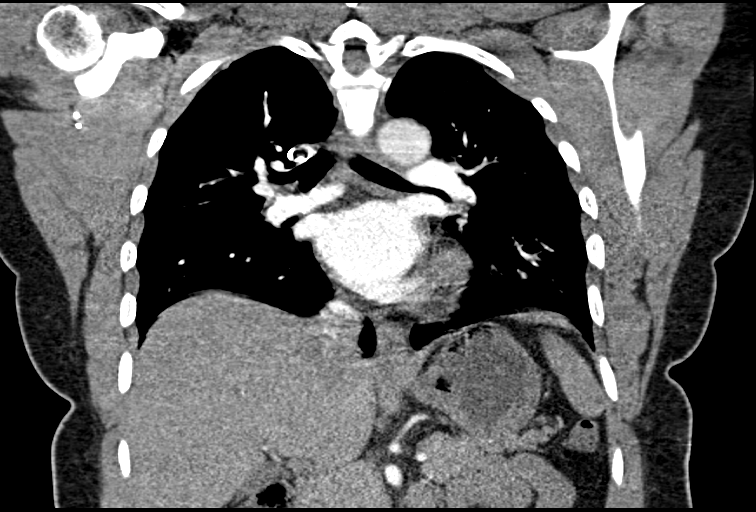

[18 of 36 positions shown; findings below may reference images not displayed]

FINDINGS: Cardiovascular: Satisfactory opacification of the pulmonary arteries
to the segmental level. No evidence of pulmonary embolism. Normal
heart size. No pericardial effusion.

Mediastinum/Nodes: No enlarged mediastinal, hilar, or axillary lymph
nodes. Thyroid gland, trachea, and esophagus demonstrate no
significant findings.

Lungs/Pleura: Lungs are clear. No pleural effusion or pneumothorax.

Upper Abdomen: No acute abnormality.

Musculoskeletal: No chest wall abnormality. No acute or significant
osseous findings.

Review of the MIP images confirms the above findings.
IMPRESSION: No pulmonary emboli.  No cause for pain identified.
